# Patient Record
Sex: Female | Born: 1940 | Race: White | Hispanic: No | Marital: Married | Smoking: Never smoker
Health system: Southern US, Community
[De-identification: ages and names within clinical notes are randomized; demographics above are authoritative.]

## PROBLEM LIST (undated history)

## (undated) DIAGNOSIS — E119 Type 2 diabetes mellitus without complications: Secondary | ICD-10-CM

## (undated) DIAGNOSIS — H269 Unspecified cataract: Secondary | ICD-10-CM

## (undated) DIAGNOSIS — M419 Scoliosis, unspecified: Secondary | ICD-10-CM

## (undated) DIAGNOSIS — M810 Age-related osteoporosis without current pathological fracture: Secondary | ICD-10-CM

## (undated) DIAGNOSIS — I10 Essential (primary) hypertension: Secondary | ICD-10-CM

## (undated) HISTORY — PX: OTHER SURGICAL HISTORY: SHX169

## (undated) HISTORY — DX: Scoliosis, unspecified: M41.9

## (undated) HISTORY — DX: Unspecified cataract: H26.9

## (undated) HISTORY — PX: TUBAL LIGATION: SHX77

## (undated) HISTORY — PX: BACK SURGERY: SHX140

---

## 2014-02-16 DIAGNOSIS — IMO0001 Reserved for inherently not codable concepts without codable children: Secondary | ICD-10-CM | POA: Insufficient documentation

## 2014-05-04 DIAGNOSIS — M810 Age-related osteoporosis without current pathological fracture: Secondary | ICD-10-CM | POA: Insufficient documentation

## 2015-04-04 DIAGNOSIS — Z789 Other specified health status: Secondary | ICD-10-CM | POA: Diagnosis not present

## 2015-04-04 DIAGNOSIS — J320 Chronic maxillary sinusitis: Secondary | ICD-10-CM | POA: Diagnosis not present

## 2015-04-04 DIAGNOSIS — Z418 Encounter for other procedures for purposes other than remedying health state: Secondary | ICD-10-CM | POA: Diagnosis not present

## 2015-04-27 DIAGNOSIS — E559 Vitamin D deficiency, unspecified: Secondary | ICD-10-CM | POA: Diagnosis not present

## 2015-04-27 DIAGNOSIS — R29890 Loss of height: Secondary | ICD-10-CM | POA: Diagnosis not present

## 2015-04-27 DIAGNOSIS — M810 Age-related osteoporosis without current pathological fracture: Secondary | ICD-10-CM | POA: Diagnosis not present

## 2015-04-27 DIAGNOSIS — Z9181 History of falling: Secondary | ICD-10-CM | POA: Diagnosis not present

## 2015-05-08 DIAGNOSIS — E1165 Type 2 diabetes mellitus with hyperglycemia: Secondary | ICD-10-CM | POA: Diagnosis not present

## 2015-05-08 DIAGNOSIS — I10 Essential (primary) hypertension: Secondary | ICD-10-CM | POA: Diagnosis not present

## 2015-06-20 DIAGNOSIS — H66012 Acute suppurative otitis media with spontaneous rupture of ear drum, left ear: Secondary | ICD-10-CM | POA: Diagnosis not present

## 2015-06-20 DIAGNOSIS — H903 Sensorineural hearing loss, bilateral: Secondary | ICD-10-CM | POA: Diagnosis not present

## 2015-07-06 ENCOUNTER — Ambulatory Visit (INDEPENDENT_AMBULATORY_CARE_PROVIDER_SITE_OTHER): Payer: PPO | Admitting: Otolaryngology

## 2015-07-06 DIAGNOSIS — H66012 Acute suppurative otitis media with spontaneous rupture of ear drum, left ear: Secondary | ICD-10-CM | POA: Diagnosis not present

## 2015-08-14 DIAGNOSIS — I10 Essential (primary) hypertension: Secondary | ICD-10-CM | POA: Diagnosis not present

## 2015-08-14 DIAGNOSIS — Z713 Dietary counseling and surveillance: Secondary | ICD-10-CM | POA: Diagnosis not present

## 2015-08-14 DIAGNOSIS — Z6824 Body mass index (BMI) 24.0-24.9, adult: Secondary | ICD-10-CM | POA: Diagnosis not present

## 2015-08-14 DIAGNOSIS — E1165 Type 2 diabetes mellitus with hyperglycemia: Secondary | ICD-10-CM | POA: Diagnosis not present

## 2015-10-23 DIAGNOSIS — Z7189 Other specified counseling: Secondary | ICD-10-CM | POA: Diagnosis not present

## 2015-10-23 DIAGNOSIS — R5383 Other fatigue: Secondary | ICD-10-CM | POA: Diagnosis not present

## 2015-10-23 DIAGNOSIS — Z0000 Encounter for general adult medical examination without abnormal findings: Secondary | ICD-10-CM | POA: Diagnosis not present

## 2015-10-23 DIAGNOSIS — Z79899 Other long term (current) drug therapy: Secondary | ICD-10-CM | POA: Diagnosis not present

## 2015-10-23 DIAGNOSIS — Z1211 Encounter for screening for malignant neoplasm of colon: Secondary | ICD-10-CM | POA: Diagnosis not present

## 2015-10-23 DIAGNOSIS — Z299 Encounter for prophylactic measures, unspecified: Secondary | ICD-10-CM | POA: Diagnosis not present

## 2015-10-23 DIAGNOSIS — Z6824 Body mass index (BMI) 24.0-24.9, adult: Secondary | ICD-10-CM | POA: Diagnosis not present

## 2015-11-20 DIAGNOSIS — I10 Essential (primary) hypertension: Secondary | ICD-10-CM | POA: Diagnosis not present

## 2015-11-20 DIAGNOSIS — Z6824 Body mass index (BMI) 24.0-24.9, adult: Secondary | ICD-10-CM | POA: Diagnosis not present

## 2015-11-20 DIAGNOSIS — E1165 Type 2 diabetes mellitus with hyperglycemia: Secondary | ICD-10-CM | POA: Diagnosis not present

## 2015-11-20 DIAGNOSIS — Z713 Dietary counseling and surveillance: Secondary | ICD-10-CM | POA: Diagnosis not present

## 2016-02-28 DIAGNOSIS — Z299 Encounter for prophylactic measures, unspecified: Secondary | ICD-10-CM | POA: Diagnosis not present

## 2016-02-28 DIAGNOSIS — Z6824 Body mass index (BMI) 24.0-24.9, adult: Secondary | ICD-10-CM | POA: Diagnosis not present

## 2016-02-28 DIAGNOSIS — E1165 Type 2 diabetes mellitus with hyperglycemia: Secondary | ICD-10-CM | POA: Diagnosis not present

## 2016-02-28 DIAGNOSIS — J329 Chronic sinusitis, unspecified: Secondary | ICD-10-CM | POA: Diagnosis not present

## 2016-02-28 DIAGNOSIS — I10 Essential (primary) hypertension: Secondary | ICD-10-CM | POA: Diagnosis not present

## 2016-03-11 DIAGNOSIS — H35033 Hypertensive retinopathy, bilateral: Secondary | ICD-10-CM | POA: Diagnosis not present

## 2016-03-11 DIAGNOSIS — I10 Essential (primary) hypertension: Secondary | ICD-10-CM | POA: Diagnosis not present

## 2016-03-11 DIAGNOSIS — H43812 Vitreous degeneration, left eye: Secondary | ICD-10-CM | POA: Diagnosis not present

## 2016-03-11 DIAGNOSIS — H43813 Vitreous degeneration, bilateral: Secondary | ICD-10-CM | POA: Diagnosis not present

## 2016-03-11 DIAGNOSIS — H3589 Other specified retinal disorders: Secondary | ICD-10-CM | POA: Diagnosis not present

## 2016-03-11 DIAGNOSIS — H524 Presbyopia: Secondary | ICD-10-CM | POA: Diagnosis not present

## 2016-03-11 DIAGNOSIS — E119 Type 2 diabetes mellitus without complications: Secondary | ICD-10-CM | POA: Diagnosis not present

## 2016-03-11 DIAGNOSIS — H35023 Exudative retinopathy, bilateral: Secondary | ICD-10-CM | POA: Diagnosis not present

## 2016-05-31 DIAGNOSIS — J320 Chronic maxillary sinusitis: Secondary | ICD-10-CM | POA: Diagnosis not present

## 2016-05-31 DIAGNOSIS — Z713 Dietary counseling and surveillance: Secondary | ICD-10-CM | POA: Diagnosis not present

## 2016-05-31 DIAGNOSIS — Z789 Other specified health status: Secondary | ICD-10-CM | POA: Diagnosis not present

## 2016-05-31 DIAGNOSIS — Z299 Encounter for prophylactic measures, unspecified: Secondary | ICD-10-CM | POA: Diagnosis not present

## 2016-05-31 DIAGNOSIS — Z6824 Body mass index (BMI) 24.0-24.9, adult: Secondary | ICD-10-CM | POA: Diagnosis not present

## 2016-06-11 DIAGNOSIS — Z299 Encounter for prophylactic measures, unspecified: Secondary | ICD-10-CM | POA: Diagnosis not present

## 2016-06-11 DIAGNOSIS — I10 Essential (primary) hypertension: Secondary | ICD-10-CM | POA: Diagnosis not present

## 2016-06-11 DIAGNOSIS — M810 Age-related osteoporosis without current pathological fracture: Secondary | ICD-10-CM | POA: Diagnosis not present

## 2016-06-11 DIAGNOSIS — J329 Chronic sinusitis, unspecified: Secondary | ICD-10-CM | POA: Diagnosis not present

## 2016-06-11 DIAGNOSIS — F4320 Adjustment disorder, unspecified: Secondary | ICD-10-CM | POA: Diagnosis not present

## 2016-06-11 DIAGNOSIS — E1165 Type 2 diabetes mellitus with hyperglycemia: Secondary | ICD-10-CM | POA: Diagnosis not present

## 2016-06-11 DIAGNOSIS — Z713 Dietary counseling and surveillance: Secondary | ICD-10-CM | POA: Diagnosis not present

## 2016-07-02 DIAGNOSIS — M810 Age-related osteoporosis without current pathological fracture: Secondary | ICD-10-CM | POA: Diagnosis not present

## 2016-07-02 DIAGNOSIS — E1165 Type 2 diabetes mellitus with hyperglycemia: Secondary | ICD-10-CM | POA: Diagnosis not present

## 2016-07-02 DIAGNOSIS — Z6824 Body mass index (BMI) 24.0-24.9, adult: Secondary | ICD-10-CM | POA: Diagnosis not present

## 2016-07-02 DIAGNOSIS — I10 Essential (primary) hypertension: Secondary | ICD-10-CM | POA: Diagnosis not present

## 2016-07-02 DIAGNOSIS — R42 Dizziness and giddiness: Secondary | ICD-10-CM | POA: Diagnosis not present

## 2016-07-02 DIAGNOSIS — Z299 Encounter for prophylactic measures, unspecified: Secondary | ICD-10-CM | POA: Diagnosis not present

## 2016-07-02 DIAGNOSIS — F4320 Adjustment disorder, unspecified: Secondary | ICD-10-CM | POA: Diagnosis not present

## 2016-07-02 DIAGNOSIS — Z713 Dietary counseling and surveillance: Secondary | ICD-10-CM | POA: Diagnosis not present

## 2016-07-11 ENCOUNTER — Ambulatory Visit (INDEPENDENT_AMBULATORY_CARE_PROVIDER_SITE_OTHER): Payer: PPO | Admitting: Otolaryngology

## 2016-07-11 DIAGNOSIS — R42 Dizziness and giddiness: Secondary | ICD-10-CM

## 2016-07-11 DIAGNOSIS — H903 Sensorineural hearing loss, bilateral: Secondary | ICD-10-CM | POA: Diagnosis not present

## 2016-07-11 DIAGNOSIS — H8112 Benign paroxysmal vertigo, left ear: Secondary | ICD-10-CM

## 2016-08-22 ENCOUNTER — Ambulatory Visit (INDEPENDENT_AMBULATORY_CARE_PROVIDER_SITE_OTHER): Payer: PPO | Admitting: Otolaryngology

## 2016-08-22 DIAGNOSIS — R42 Dizziness and giddiness: Secondary | ICD-10-CM | POA: Diagnosis not present

## 2016-09-17 DIAGNOSIS — Z6824 Body mass index (BMI) 24.0-24.9, adult: Secondary | ICD-10-CM | POA: Diagnosis not present

## 2016-09-17 DIAGNOSIS — I10 Essential (primary) hypertension: Secondary | ICD-10-CM | POA: Diagnosis not present

## 2016-09-17 DIAGNOSIS — Z789 Other specified health status: Secondary | ICD-10-CM | POA: Diagnosis not present

## 2016-09-17 DIAGNOSIS — Z299 Encounter for prophylactic measures, unspecified: Secondary | ICD-10-CM | POA: Diagnosis not present

## 2016-09-17 DIAGNOSIS — E1165 Type 2 diabetes mellitus with hyperglycemia: Secondary | ICD-10-CM | POA: Diagnosis not present

## 2016-09-17 DIAGNOSIS — M810 Age-related osteoporosis without current pathological fracture: Secondary | ICD-10-CM | POA: Diagnosis not present

## 2016-10-29 DIAGNOSIS — Z0000 Encounter for general adult medical examination without abnormal findings: Secondary | ICD-10-CM | POA: Diagnosis not present

## 2016-10-29 DIAGNOSIS — R5383 Other fatigue: Secondary | ICD-10-CM | POA: Diagnosis not present

## 2016-10-29 DIAGNOSIS — Z1389 Encounter for screening for other disorder: Secondary | ICD-10-CM | POA: Diagnosis not present

## 2016-10-29 DIAGNOSIS — Z1231 Encounter for screening mammogram for malignant neoplasm of breast: Secondary | ICD-10-CM | POA: Diagnosis not present

## 2016-10-29 DIAGNOSIS — Z7189 Other specified counseling: Secondary | ICD-10-CM | POA: Diagnosis not present

## 2016-10-29 DIAGNOSIS — Z79899 Other long term (current) drug therapy: Secondary | ICD-10-CM | POA: Diagnosis not present

## 2016-10-29 DIAGNOSIS — I10 Essential (primary) hypertension: Secondary | ICD-10-CM | POA: Diagnosis not present

## 2016-10-29 DIAGNOSIS — M810 Age-related osteoporosis without current pathological fracture: Secondary | ICD-10-CM | POA: Diagnosis not present

## 2016-10-29 DIAGNOSIS — E1165 Type 2 diabetes mellitus with hyperglycemia: Secondary | ICD-10-CM | POA: Diagnosis not present

## 2016-10-29 DIAGNOSIS — Z1211 Encounter for screening for malignant neoplasm of colon: Secondary | ICD-10-CM | POA: Diagnosis not present

## 2016-10-29 DIAGNOSIS — Z299 Encounter for prophylactic measures, unspecified: Secondary | ICD-10-CM | POA: Diagnosis not present

## 2016-10-29 DIAGNOSIS — Z6824 Body mass index (BMI) 24.0-24.9, adult: Secondary | ICD-10-CM | POA: Diagnosis not present

## 2016-12-03 DIAGNOSIS — Z6824 Body mass index (BMI) 24.0-24.9, adult: Secondary | ICD-10-CM | POA: Diagnosis not present

## 2016-12-03 DIAGNOSIS — M810 Age-related osteoporosis without current pathological fracture: Secondary | ICD-10-CM | POA: Diagnosis not present

## 2016-12-03 DIAGNOSIS — E1165 Type 2 diabetes mellitus with hyperglycemia: Secondary | ICD-10-CM | POA: Diagnosis not present

## 2016-12-03 DIAGNOSIS — Z299 Encounter for prophylactic measures, unspecified: Secondary | ICD-10-CM | POA: Diagnosis not present

## 2016-12-03 DIAGNOSIS — J329 Chronic sinusitis, unspecified: Secondary | ICD-10-CM | POA: Diagnosis not present

## 2016-12-03 DIAGNOSIS — Z789 Other specified health status: Secondary | ICD-10-CM | POA: Diagnosis not present

## 2016-12-03 DIAGNOSIS — I10 Essential (primary) hypertension: Secondary | ICD-10-CM | POA: Diagnosis not present

## 2016-12-19 DIAGNOSIS — Z79899 Other long term (current) drug therapy: Secondary | ICD-10-CM | POA: Diagnosis not present

## 2016-12-19 DIAGNOSIS — Z780 Asymptomatic menopausal state: Secondary | ICD-10-CM | POA: Diagnosis not present

## 2016-12-19 DIAGNOSIS — Z5181 Encounter for therapeutic drug level monitoring: Secondary | ICD-10-CM | POA: Diagnosis not present

## 2016-12-19 DIAGNOSIS — M810 Age-related osteoporosis without current pathological fracture: Secondary | ICD-10-CM | POA: Diagnosis not present

## 2017-01-15 DIAGNOSIS — Z6825 Body mass index (BMI) 25.0-25.9, adult: Secondary | ICD-10-CM | POA: Diagnosis not present

## 2017-01-15 DIAGNOSIS — N390 Urinary tract infection, site not specified: Secondary | ICD-10-CM | POA: Diagnosis not present

## 2017-01-15 DIAGNOSIS — Z299 Encounter for prophylactic measures, unspecified: Secondary | ICD-10-CM | POA: Diagnosis not present

## 2017-01-15 DIAGNOSIS — Z713 Dietary counseling and surveillance: Secondary | ICD-10-CM | POA: Diagnosis not present

## 2017-01-15 DIAGNOSIS — R350 Frequency of micturition: Secondary | ICD-10-CM | POA: Diagnosis not present

## 2017-01-31 DIAGNOSIS — M1612 Unilateral primary osteoarthritis, left hip: Secondary | ICD-10-CM | POA: Diagnosis not present

## 2017-01-31 DIAGNOSIS — M5442 Lumbago with sciatica, left side: Secondary | ICD-10-CM | POA: Diagnosis not present

## 2017-01-31 DIAGNOSIS — I10 Essential (primary) hypertension: Secondary | ICD-10-CM | POA: Diagnosis not present

## 2017-01-31 DIAGNOSIS — M419 Scoliosis, unspecified: Secondary | ICD-10-CM | POA: Diagnosis not present

## 2017-01-31 DIAGNOSIS — M5440 Lumbago with sciatica, unspecified side: Secondary | ICD-10-CM | POA: Diagnosis not present

## 2017-01-31 DIAGNOSIS — M47816 Spondylosis without myelopathy or radiculopathy, lumbar region: Secondary | ICD-10-CM | POA: Diagnosis not present

## 2017-01-31 DIAGNOSIS — Z299 Encounter for prophylactic measures, unspecified: Secondary | ICD-10-CM | POA: Diagnosis not present

## 2017-01-31 DIAGNOSIS — M85852 Other specified disorders of bone density and structure, left thigh: Secondary | ICD-10-CM | POA: Diagnosis not present

## 2017-01-31 DIAGNOSIS — E1165 Type 2 diabetes mellitus with hyperglycemia: Secondary | ICD-10-CM | POA: Diagnosis not present

## 2017-01-31 DIAGNOSIS — Z6825 Body mass index (BMI) 25.0-25.9, adult: Secondary | ICD-10-CM | POA: Diagnosis not present

## 2017-01-31 DIAGNOSIS — M545 Low back pain: Secondary | ICD-10-CM | POA: Diagnosis not present

## 2017-01-31 DIAGNOSIS — M4854XA Collapsed vertebra, not elsewhere classified, thoracic region, initial encounter for fracture: Secondary | ICD-10-CM | POA: Diagnosis not present

## 2017-01-31 DIAGNOSIS — M9903 Segmental and somatic dysfunction of lumbar region: Secondary | ICD-10-CM | POA: Diagnosis not present

## 2017-02-01 ENCOUNTER — Emergency Department (HOSPITAL_COMMUNITY): Payer: PPO

## 2017-02-01 ENCOUNTER — Other Ambulatory Visit: Payer: Self-pay

## 2017-02-01 ENCOUNTER — Encounter (HOSPITAL_COMMUNITY): Payer: Self-pay | Admitting: *Deleted

## 2017-02-01 ENCOUNTER — Emergency Department (HOSPITAL_COMMUNITY)
Admission: EM | Admit: 2017-02-01 | Discharge: 2017-02-01 | Disposition: A | Discharge status: Home / Self Care | Payer: PPO | Attending: Emergency Medicine | Admitting: Emergency Medicine

## 2017-02-01 DIAGNOSIS — E119 Type 2 diabetes mellitus without complications: Secondary | ICD-10-CM | POA: Diagnosis not present

## 2017-02-01 DIAGNOSIS — M79605 Pain in left leg: Secondary | ICD-10-CM | POA: Diagnosis not present

## 2017-02-01 DIAGNOSIS — I10 Essential (primary) hypertension: Secondary | ICD-10-CM | POA: Diagnosis not present

## 2017-02-01 DIAGNOSIS — M5416 Radiculopathy, lumbar region: Secondary | ICD-10-CM

## 2017-02-01 DIAGNOSIS — M4726 Other spondylosis with radiculopathy, lumbar region: Secondary | ICD-10-CM

## 2017-02-01 DIAGNOSIS — M545 Low back pain: Secondary | ICD-10-CM | POA: Diagnosis not present

## 2017-02-01 DIAGNOSIS — R52 Pain, unspecified: Secondary | ICD-10-CM | POA: Diagnosis not present

## 2017-02-01 DIAGNOSIS — Z79899 Other long term (current) drug therapy: Secondary | ICD-10-CM | POA: Insufficient documentation

## 2017-02-01 DIAGNOSIS — M79662 Pain in left lower leg: Secondary | ICD-10-CM | POA: Diagnosis not present

## 2017-02-01 HISTORY — DX: Essential (primary) hypertension: I10

## 2017-02-01 HISTORY — DX: Age-related osteoporosis without current pathological fracture: M81.0

## 2017-02-01 HISTORY — DX: Type 2 diabetes mellitus without complications: E11.9

## 2017-02-01 LAB — BASIC METABOLIC PANEL
ANION GAP: 8 (ref 5–15)
BUN: 18 mg/dL (ref 6–20)
CALCIUM: 9.3 mg/dL (ref 8.9–10.3)
CO2: 25 mmol/L (ref 22–32)
CREATININE: 0.66 mg/dL (ref 0.44–1.00)
Chloride: 107 mmol/L (ref 101–111)
Glucose, Bld: 180 mg/dL — ABNORMAL HIGH (ref 65–99)
Potassium: 4.1 mmol/L (ref 3.5–5.1)
SODIUM: 140 mmol/L (ref 135–145)

## 2017-02-01 LAB — CBC WITH DIFFERENTIAL/PLATELET
BASOS ABS: 0 10*3/uL (ref 0.0–0.1)
BASOS PCT: 0 %
EOS ABS: 0 10*3/uL (ref 0.0–0.7)
Eosinophils Relative: 0 %
HEMATOCRIT: 44.5 % (ref 36.0–46.0)
HEMOGLOBIN: 14.8 g/dL (ref 12.0–15.0)
Lymphocytes Relative: 12 %
Lymphs Abs: 0.8 10*3/uL (ref 0.7–4.0)
MCH: 30.7 pg (ref 26.0–34.0)
MCHC: 33.3 g/dL (ref 30.0–36.0)
MCV: 92.3 fL (ref 78.0–100.0)
MONOS PCT: 5 %
Monocytes Absolute: 0.3 10*3/uL (ref 0.1–1.0)
NEUTROS ABS: 5.6 10*3/uL (ref 1.7–7.7)
NEUTROS PCT: 83 %
Platelets: 295 10*3/uL (ref 150–400)
RBC: 4.82 MIL/uL (ref 3.87–5.11)
RDW: 14.1 % (ref 11.5–15.5)
WBC: 6.8 10*3/uL (ref 4.0–10.5)

## 2017-02-01 MED ORDER — SODIUM CHLORIDE 0.9 % IV SOLN
INTRAVENOUS | Status: DC
  Administered 2017-02-01: 19:00:00 via INTRAVENOUS

## 2017-02-01 MED ORDER — FENTANYL CITRATE (PF) 100 MCG/2ML IJ SOLN
25.0000 ug | Freq: Once | INTRAMUSCULAR | Status: AC
  Administered 2017-02-01: 25 ug via INTRAVENOUS
  Filled 2017-02-01: qty 2

## 2017-02-01 MED ORDER — HYDROCODONE-ACETAMINOPHEN 5-325 MG PO TABS
1.0000 | ORAL_TABLET | Freq: Once | ORAL | Status: DC
  Filled 2017-02-01: qty 1

## 2017-02-01 MED ORDER — ONDANSETRON HCL 4 MG/2ML IJ SOLN
4.0000 mg | Freq: Once | INTRAMUSCULAR | Status: AC
  Administered 2017-02-01: 4 mg via INTRAVENOUS
  Filled 2017-02-01: qty 2

## 2017-02-01 MED ORDER — HYDROCODONE-ACETAMINOPHEN 5-325 MG PO TABS: 1.0000 | ORAL_TABLET | ORAL | 0 refills | Status: DC | PRN

## 2017-02-01 MED ORDER — ACETAMINOPHEN 325 MG PO TABS: 650.0000 mg | ORAL_TABLET | Freq: Once | ORAL | Status: DC

## 2017-02-01 MED ORDER — HYDROCODONE-ACETAMINOPHEN 5-325 MG PO TABS: 1.0000 | ORAL_TABLET | ORAL | 0 refills | Status: AC | PRN

## 2017-02-01 MED ORDER — FENTANYL CITRATE (PF) 100 MCG/2ML IJ SOLN: 50.0000 ug | Freq: Once | INTRAMUSCULAR | Status: DC

## 2017-02-01 NOTE — ED Triage Notes (Signed)
Pt brought in by [facility] with c/o lower leg pain that started a few days ago. Denies injury. Pt also c/o nausea that started today. Denies vomiting. BP 197/71 and CBG 273 for EMS. Pt was given Prednisone by PCP for the pain, no relief.

## 2017-02-01 NOTE — ED Provider Notes (Signed)
[facility] EMERGENCY DEPARTMENT Provider Note   CSN: [id number] Arrival date & time: 02/01/17  1711     History   Chief Complaint Chief Complaint  Patient presents with  . Leg Pain    HPI Lacey Snyder is a 76 y.o. female.  She presents for evaluation of the left leg pain, which is worse with walking for several days.  Her PCP, and sent for outpatient x-rays of the lower back and left hip.  She was told that these showed arthritis in her lower back but no fractures.  She was started on anti-inflammatory with an injection of steroid and a prescription of prednisone which she started today.  She is not taking anything for pain.  She denies fever, chills, nausea, vomiting, weakness, dizziness, bowel or bladder dysfunction.  She has chronic lower back pain which is unchanged.  There is been no trauma to the back or left leg.  There are no other known modifying factors.  HPI  Past Medical History:  Diagnosis Date  . Diabetes mellitus without complication ([place])   . Hypertension   . Osteoporosis     There are no active problems to display for this patient.   Past Surgical History:  Procedure Laterality Date  . Arm Surgery Left    fracture  . TUBAL LIGATION      OB History    No data available       Home Medications    Prior to Admission medications   Medication Sig Start Date End Date Taking? Authorizing Provider  baclofen (LIORESAL) 20 MG tablet Take 20 mg by mouth 2 (two) times daily as needed. 01/31/17  Yes [provider]  Cholecalciferol (VITAMIN D-1000 MAX ST) 1000 units tablet Take 1 tablet by mouth daily.   Yes [provider]  lisinopril-hydrochlorothiazide (PRINZIDE,ZESTORETIC) 20-12.5 MG tablet Take 1 tablet by mouth daily. 11/04/16  Yes [provider]  Omega-3 Fatty Acids (FISH OIL) 1000 MG CAPS Take 1 capsule by mouth daily.   Yes [provider]  predniSONE (STERAPRED UNI-PAK 21 TAB) 5 MG (21) TBPK tablet Take 5-30 mg  by mouth See admin instructions. Take as directed starting on 01/31/2017 (6,5,4,3,2,1) 01/31/17  Yes [provider]  HYDROcodone-acetaminophen (NORCO/VICODIN) 5-325 MG tablet Take 1 tablet by mouth every 4 (four) hours as needed. 02/01/17   Daleen Bo, MD  HYDROcodone-acetaminophen (NORCO/VICODIN) 5-325 MG tablet Take 1 tablet by mouth every 4 (four) hours as needed. 02/01/17   Daleen Bo, MD    Family History No family history on file.  Social History Social History   Tobacco Use  . Smoking status: Never Smoker  . Smokeless tobacco: Never Used  Substance Use Topics  . Alcohol use: No    Frequency: Never  . Drug use: No     Allergies   Patient has no known allergies.   Review of Systems Review of Systems  All other systems reviewed and are negative.    Physical Exam Updated Vital Signs BP (!) 174/76   Pulse 64   Temp 98.4 F (36.9 C) (Oral)   Resp 14   Ht 5\' 2"  (1.575 m)   Wt 59 kg (130 lb)   SpO2 97%   BMI 23.78 kg/m   Physical Exam  Constitutional: She is oriented to person, place, and time. She appears well-developed.  Elderly, frail  HENT:  Head: Normocephalic and atraumatic.  Eyes: Conjunctivae and EOM are normal. Pupils are equal, round, and reactive to light.  Neck:  Normal range of motion and phonation normal. Neck supple.  Cardiovascular: Normal rate.  Pulmonary/Chest: Effort normal.  Musculoskeletal:  She is able to sit up without significant lumbar pain.  There is evident thoraco-lumbar scoliosis.  The back is nontender to palpation.  Normal range of motion of arms and legs bilaterally.  No focal tenderness of the left lower leg.  There is no swelling of the left lower leg, foot or popliteal regions.  Neurological: She is alert and oriented to person, place, and time. She exhibits normal muscle tone.  Skin: Skin is warm and dry.  Psychiatric: She has a normal mood and affect. Her behavior is normal. Judgment and thought content normal.    Nursing note and vitals reviewed.    ED Treatments / Results  Labs (all labs ordered are listed, but only abnormal results are displayed) Labs Reviewed  BASIC METABOLIC PANEL - Abnormal; Notable for the following components:      Result Value   Glucose, Bld 180 (*)    All other components within normal limits  CBC WITH DIFFERENTIAL/PLATELET    EKG  EKG Interpretation None       Radiology Dg Tibia/fibula Left  Result Date: 02/01/2017 CLINICAL DATA:  Left lower leg pain for several days EXAM: LEFT TIBIA AND FIBULA - 2 VIEW COMPARISON:  None. FINDINGS: There is no evidence of fracture or other focal bone lesions. Soft tissues are unremarkable. IMPRESSION: No acute abnormality noted. Electronically Signed   By: Inez Catalina M.D.   On: 02/01/2017 18:25   Ct Lumbar Spine Wo Contrast  Result Date: 02/01/2017 CLINICAL DATA:  LEFT leg pain and weakness for several days. History of osteoporosis. EXAM: CT LUMBAR SPINE WITHOUT CONTRAST TECHNIQUE: Multidetector CT imaging of the lumbar spine was performed without intravenous contrast administration. Multiplanar CT image reconstructions were also generated. COMPARISON:  None. FINDINGS: SEGMENTATION: For the purposes of this report the last well-formed intervertebral disc space is reported as L5-S1. ALIGNMENT: Maintained lumbar lordosis. Grade 1 L2-3 retrolisthesis, minimal grade 1 L3-4 retrolisthesis. No spondylolysis. VERTEBRAE: Vertebral bodies and posterior elements are intact. Old T12 moderate compression fracture superior endplate Schmorl's node. Severe L2-3 disc height loss with vacuum disc and bulky ventral endplate spurring, consistent with degenerative disc. Moderate L3-4, mild T12-L1 and L1-2 degenerative discs with vacuum phenomena. Osteopenia without destructive bony lesions. PARASPINAL AND OTHER SOFT TISSUES: Small LEFT pleural effusion. Mild calcific atherosclerosis aortoiliac vessels. 11 mm LEFT interpolar cyst. DISC LEVELS: T12-L1:  Small broad-based disc osteophyte complex without canal stenosis or neural foraminal narrowing. L1-2: Annular bulging, no canal stenosis or neural foraminal narrowing. L2-3: Retrolisthesis. Small broad-based disc osteophyte complex, mild facet arthropathy and ligamentum flavum redundancy. Minimal canal stenosis. No neural foraminal narrowing. L3-4: Retrolisthesis. Moderate broad-based disc bulge, superimposed suspected isodense 10 x 8 mm contiguous disc extrusion. Moderate facet arthropathy and ligamentum flavum redundancy. Moderate canal stenosis with narrowed LEFT lateral recess which could affect the traversing LEFT L4 nerve. Mild bilateral neural foraminal narrowing. L4-5: Small broad-based disc bulge, moderate facet arthropathy and ligamentum flavum redundancy without canal stenosis or neural foraminal narrowing. L5-S1: Small broad-based disc bulge, mild to moderate facet arthropathy and ligamentum flavum redundancy without canal stenosis or neural foraminal narrowing. IMPRESSION: 1. 10 x 8 mm suspected L3-4 disc extrusion resulting in traversing LEFT L4 nerve impingement which could be confirmed with MRI. 2. Grade 1 L2-3 retrolisthesis and grade 1 L3-4 retrolisthesis on degenerative basis. 3. Degenerative change of the lumbar spine. Moderate canal stenosis L3-4. 4. Mild  L3-4 neural foraminal narrowing. Aortic Atherosclerosis (ICD10-I70.0). Electronically Signed   By: Elon Alas M.D.   On: 02/01/2017 20:05    Procedures Procedures (including critical care time)  Medications Ordered in ED Medications  0.9 %  sodium chloride infusion ( Intravenous Stopped 02/01/17 2037)  fentaNYL (SUBLIMAZE) injection 25 mcg (25 mcg Intravenous Given 02/01/17 1908)  ondansetron (ZOFRAN) injection 4 mg (4 mg Intravenous Given 02/01/17 1908)  fentaNYL (SUBLIMAZE) injection 25 mcg (25 mcg Intravenous Given 02/01/17 2032)     Initial Impression / Assessment and Plan / ED Course  I have reviewed the triage vital  signs and the nursing notes.  Pertinent labs & imaging results that were available during my care of the patient were reviewed by me and considered in my medical decision making (see chart for details).  Clinical Course as of Feb 02 2156  Sat Feb 01, 2017  1856 The patient is now vomiting and has not yet received her medications.  Will hold on oral treatment. Additional treatment ordered IV fluids, IV Zofran and fentanyl.  [EW]  1916 There are no additional family members at the patient's bedside, the patient has received her medication and is more comfortable in appearance, and the family members are grateful that she has been medicated.  [EW]    Clinical Course User Index [EW] Daleen Bo, MD     Patient Vitals for the past 24 hrs:  BP Temp Temp src Pulse Resp SpO2 Height Weight  02/01/17 2130 (!) 174/76 - - 64 - 97 % - -  02/01/17 2100 (!) 168/57 - - (!) 54 - 95 % - -  02/01/17 2036 (!) 166/64 - - 65 14 100 % - -  02/01/17 2030 (!) 166/64 - - 61 - 98 % - -  02/01/17 2000 (!) 172/66 - - (!) 54 - 91 % - -  02/01/17 1900 (!) 187/80 - - 64 - 98 % - -  02/01/17 1830 (!) 168/97 - - 60 - 92 % - -  02/01/17 1800 (!) 174/74 - - 65 - 96 % - -  02/01/17 1730 (!) 178/83 - - 71 - 95 % - -  02/01/17 1718 (!) 161/92 98.4 F (36.9 C) Oral 66 16 97 % - -  02/01/17 1717 - - - - - - 5\' 2"  (1.575 m) 59 kg (130 lb)    At discharge-patient states her pain is controlled and she is ready to go home.  Findings discussed with family members and patient, all questions answered    Final Clinical Impressions(s) / ED Diagnoses   Final diagnoses:  Lumbar radiculopathy  Osteoarthritis of spine with radiculopathy, lumbar region    Radicular lumbar pain without spinal myelopathy.  Doubt fracture, DVT, soft tissue infection.  Nursing Notes Reviewed/ Care Coordinated Applicable Imaging Reviewed Interpretation of Laboratory Data incorporated into ED treatment  The patient appears reasonably screened  and/or stabilized for discharge and I doubt any other medical condition or other [facility] requiring further screening, evaluation, or treatment in the ED at this time prior to discharge.  Plan: Home Medications-continue current medications; Home Treatments-rest, heat applications; return here if the recommended treatment, does not improve the symptoms; Recommended follow up-PCP, orthopedist or neurosurgery as needed.   ED Discharge Orders        Ordered    HYDROcodone-acetaminophen (NORCO/VICODIN) 5-325 MG tablet  Every 4 hours PRN     02/01/17 2118    HYDROcodone-acetaminophen (NORCO/VICODIN) 5-325 MG tablet  Every 4 hours PRN  02/01/17 2118       Daleen Bo, MD 02/01/17 2159

## 2017-02-01 NOTE — ED Notes (Signed)
Pt taken to xray 

## 2017-02-01 NOTE — Discharge Instructions (Signed)
Use heat on the lower back 3 or 4 times a day, to help your discomfort.  Continue taking the prednisone, and baclofen which were previously prescribed.  Follow-up with your primary care doctor, and orthopedic doctor, or a neurosurgeon for ongoing management of your lumbar radiculopathy.  You have moderate degenerative joint disease of the lower back which is the basic cause for your discomfort.  Return here, if needed, for problems.

## 2017-02-04 DIAGNOSIS — M47816 Spondylosis without myelopathy or radiculopathy, lumbar region: Secondary | ICD-10-CM | POA: Diagnosis not present

## 2017-02-04 DIAGNOSIS — M9903 Segmental and somatic dysfunction of lumbar region: Secondary | ICD-10-CM | POA: Diagnosis not present

## 2017-02-04 DIAGNOSIS — M5442 Lumbago with sciatica, left side: Secondary | ICD-10-CM | POA: Diagnosis not present

## 2017-02-05 DIAGNOSIS — M47816 Spondylosis without myelopathy or radiculopathy, lumbar region: Secondary | ICD-10-CM | POA: Diagnosis not present

## 2017-02-05 DIAGNOSIS — M5442 Lumbago with sciatica, left side: Secondary | ICD-10-CM | POA: Diagnosis not present

## 2017-02-05 DIAGNOSIS — M9903 Segmental and somatic dysfunction of lumbar region: Secondary | ICD-10-CM | POA: Diagnosis not present

## 2017-02-06 MED FILL — Oxycodone w/ Acetaminophen Tab 5-325 MG: ORAL | Qty: 6 | Status: CN

## 2017-02-06 MED FILL — Hydrocodone-Acetaminophen Tab 5-325 MG: ORAL | Qty: 6 | Status: AC

## 2017-02-07 DIAGNOSIS — M47816 Spondylosis without myelopathy or radiculopathy, lumbar region: Secondary | ICD-10-CM | POA: Diagnosis not present

## 2017-02-07 DIAGNOSIS — M5442 Lumbago with sciatica, left side: Secondary | ICD-10-CM | POA: Diagnosis not present

## 2017-02-07 DIAGNOSIS — M9903 Segmental and somatic dysfunction of lumbar region: Secondary | ICD-10-CM | POA: Diagnosis not present

## 2017-02-10 DIAGNOSIS — M47816 Spondylosis without myelopathy or radiculopathy, lumbar region: Secondary | ICD-10-CM | POA: Diagnosis not present

## 2017-02-10 DIAGNOSIS — M9903 Segmental and somatic dysfunction of lumbar region: Secondary | ICD-10-CM | POA: Diagnosis not present

## 2017-02-10 DIAGNOSIS — M5442 Lumbago with sciatica, left side: Secondary | ICD-10-CM | POA: Diagnosis not present

## 2017-02-13 ENCOUNTER — Encounter (HOSPITAL_COMMUNITY): Payer: Self-pay | Admitting: *Deleted

## 2017-02-13 ENCOUNTER — Other Ambulatory Visit: Payer: Self-pay

## 2017-02-13 ENCOUNTER — Emergency Department (HOSPITAL_COMMUNITY)
Admission: EM | Admit: 2017-02-13 | Discharge: 2017-02-13 | Disposition: A | Discharge status: Home / Self Care | Payer: PPO | Attending: Emergency Medicine | Admitting: Emergency Medicine

## 2017-02-13 ENCOUNTER — Emergency Department (HOSPITAL_COMMUNITY): Payer: PPO

## 2017-02-13 DIAGNOSIS — M5126 Other intervertebral disc displacement, lumbar region: Secondary | ICD-10-CM | POA: Insufficient documentation

## 2017-02-13 DIAGNOSIS — I10 Essential (primary) hypertension: Secondary | ICD-10-CM | POA: Insufficient documentation

## 2017-02-13 DIAGNOSIS — E119 Type 2 diabetes mellitus without complications: Secondary | ICD-10-CM | POA: Insufficient documentation

## 2017-02-13 DIAGNOSIS — M79605 Pain in left leg: Secondary | ICD-10-CM | POA: Diagnosis present

## 2017-02-13 DIAGNOSIS — M5127 Other intervertebral disc displacement, lumbosacral region: Secondary | ICD-10-CM | POA: Diagnosis not present

## 2017-02-13 DIAGNOSIS — M545 Low back pain: Secondary | ICD-10-CM | POA: Diagnosis not present

## 2017-02-13 MED ORDER — ONDANSETRON 4 MG PO TBDP
4.0000 mg | ORAL_TABLET | Freq: Once | ORAL | Status: AC
  Administered 2017-02-13: 4 mg via ORAL
  Filled 2017-02-13: qty 1

## 2017-02-13 MED ORDER — ONDANSETRON HCL 4 MG PO TABS: 4.0000 mg | ORAL_TABLET | Freq: Four times a day (QID) | ORAL | 0 refills | Status: AC

## 2017-02-13 MED ORDER — ONDANSETRON 4 MG PO TBDP
4.0000 mg | ORAL_TABLET | Freq: Once | ORAL | Status: AC | PRN
  Administered 2017-02-13: 4 mg via ORAL
  Filled 2017-02-13: qty 1

## 2017-02-13 MED ORDER — PREDNISONE 10 MG PO TABS: 10.0000 mg | ORAL_TABLET | Freq: Every day | ORAL | 0 refills | Status: DC

## 2017-02-13 NOTE — Discharge Instructions (Signed)
You have a herniated disc between lumbar 3 and lumbar 4 which is putting pressure on the nerve root of lumbar 4.  Prescription for prednisone and nausea medication.  Follow-up with neurosurgeon.  Phone number given.

## 2017-02-13 NOTE — ED Provider Notes (Signed)
[facility] EMERGENCY DEPARTMENT Provider Note   CSN: [id number] Arrival date & time: 02/13/17  1333     History   Chief Complaint Chief Complaint  Patient presents with  . Leg Pain  . Back Pain    HPI Lacey Snyder is a 76 y.o. female.  Persistent pain in the left lateral lower leg for 2-3 weeks.  CT of lumbar spine on 02/01/17 revealed a L3-L4 disc protrusion.  No bowel or bladder incontinence.  Patient has tried baclofen and prednisone with minimal relief.  She is able to ambulate with pain.  Severity of pain is moderate.  Nothing makes symptoms better or worse.      Past Medical History:  Diagnosis Date  . Diabetes mellitus without complication ([place])   . Hypertension   . Osteoporosis     There are no active problems to display for this patient.   Past Surgical History:  Procedure Laterality Date  . Arm Surgery Left    fracture  . TUBAL LIGATION      OB History    No data available       Home Medications    Prior to Admission medications   Medication Sig Start Date End Date Taking? Authorizing Provider  Cholecalciferol (VITAMIN D-1000 MAX ST) 1000 units tablet Take 1 tablet by mouth daily.   Yes [provider]  HYDROcodone-acetaminophen (NORCO/VICODIN) 5-325 MG tablet Take 1 tablet by mouth every 4 (four) hours as needed. 02/01/17  Yes Daleen Bo, MD  lisinopril-hydrochlorothiazide (PRINZIDE,ZESTORETIC) 20-12.5 MG tablet Take 1 tablet by mouth daily. 11/04/16  Yes [provider]  Omega-3 Fatty Acids (FISH OIL) 1000 MG CAPS Take 1 capsule by mouth daily.   Yes [provider]  HYDROcodone-acetaminophen (NORCO/VICODIN) 5-325 MG tablet Take 1 tablet by mouth every 4 (four) hours as needed. Patient not taking: Reported on 02/13/2017 02/01/17   Daleen Bo, MD  ondansetron (ZOFRAN) 4 MG tablet Take 1 tablet (4 mg total) by mouth every 6 (six) hours. 02/13/17   Nat Christen, MD  predniSONE (DELTASONE) 10 MG  tablet Take 1 tablet (10 mg total) by mouth daily with breakfast. 2 tablets daily for 5 days; 1 tablet daily for 5 days 02/13/17   Nat Christen, MD    Family History History reviewed. No pertinent family history.  Social History Social History   Tobacco Use  . Smoking status: Never Smoker  . Smokeless tobacco: Never Used  Substance Use Topics  . Alcohol use: No    Frequency: Never  . Drug use: No     Allergies   Patient has no known allergies.   Review of Systems Review of Systems  All other systems reviewed and are negative.    Physical Exam Updated Vital Signs BP (!) 123/54   Pulse (!) 56   Temp 97.6 F (36.4 C) (Oral)   Resp 14   SpO2 92%   Physical Exam  Constitutional: She is oriented to person, place, and time. She appears well-developed and well-nourished.  HENT:  Head: Normocephalic and atraumatic.  Eyes: Conjunctivae are normal.  Neck: Neck supple.  Cardiovascular: Normal rate and regular rhythm.  Pulmonary/Chest: Effort normal and breath sounds normal.  Abdominal: Soft. Bowel sounds are normal.  Musculoskeletal: Normal range of motion.  Neurological: She is alert and oriented to person, place, and time.  Patient points to lateral aspect of left fibula as area of pain and numbness  Skin: Skin is warm and dry.  Psychiatric: She has  a normal mood and affect. Her behavior is normal.  Nursing note and vitals reviewed.    ED Treatments / Results  Labs (all labs ordered are listed, but only abnormal results are displayed) Labs Reviewed - No data to display  EKG  EKG Interpretation None       Radiology Mr Lumbar Spine Wo Contrast  Result Date: 02/13/2017 CLINICAL DATA:  Initial evaluation for left lower extremity radicular pain. EXAM: MRI LUMBAR SPINE WITHOUT CONTRAST TECHNIQUE: Multiplanar, multisequence MR imaging of the lumbar spine was performed. No intravenous contrast was administered. COMPARISON:  Prior CT from 02/01/2017. FINDINGS:  Segmentation: Normal segmentation. Lowest well-formed disc labeled L5-S1. Alignment: 5 mm retrolisthesis of L3 on L4. Trace retrolisthesis of L2 on L3. Mild reversal of the normal upper lumbar lordosis. Vertebrae: T12 compression fracture is chronic in nature without associated edema. Superimposed endplate Schmorl's node. Vertebral body heights otherwise maintained. No acute fracture. Chronic reactive endplate changes about the L2-3 and L3-4 interspaces. Bone marrow signal intensity within normal limits. No discrete or worrisome osseous lesions. Conus medullaris and cauda equina: Conus extends to the L1 level. Conus and cauda equina appear normal. Paraspinal and other soft tissues: Paraspinous soft tissues demonstrate no acute abnormality. T2 hyperintense cyst noted within the left kidney. Visualized visceral structures otherwise unremarkable. Disc levels: T12-L1:  Disc bulge with disc desiccation.  No stenosis. L1-2: Disc desiccation without significant disc bulge. No stenosis. L2-3: Trace retrolisthesis. Diffuse disc bulge with disc desiccation and intervertebral disc space narrowing. Chronic reactive endplate changes. Superimposed shallow left foraminal disc protrusion without impingement. No canal or foraminal stenosis. L3-4: 5 mm retrolisthesis. Diffuse disc bulge with disc desiccation and intervertebral disc space narrowing. Superimposed left subarticular disc extrusion with inferior migration, extending posteriorly to the L4 vertebral body. Inferior migration extends 2.2 cm inferior to the parent L3-4 disc space. Extruded disc fragment measures 9 x 13 x 23 mm (AP by transverse by craniocaudad). Probable impingement of the descending left L4 nerve root. Mild facet ligamentum flavum hypertrophy. Moderate spinal stenosis. Moderate right with mild left L3 foraminal narrowing. L4-5: Disc desiccation with mild annular disc bulge. Mild to moderate facet and ligamentum flavum hypertrophy. No significant canal  stenosis. Mild bilateral L4 foraminal narrowing. L5-S1: No significant disc bulge. Mild facet hypertrophy. No stenosis. IMPRESSION: 1. Moderate size left subarticular disc extrusion with inferior migration at L3-4 with probable L4 nerve root impingement. 2. Additional mild multilevel degenerative spondylolysis as above without significant stenosis or impingement. Electronically Signed   By: Jeannine Boga M.D.   On: 02/13/2017 20:44    Procedures Procedures (including critical care time)  Medications Ordered in ED Medications  ondansetron (ZOFRAN-ODT) disintegrating tablet 4 mg (4 mg Oral Given 02/13/17 1420)  ondansetron (ZOFRAN-ODT) disintegrating tablet 4 mg (4 mg Oral Given 02/13/17 1836)     Initial Impression / Assessment and Plan / ED Course  I have reviewed the triage vital signs and the nursing notes.  Pertinent labs & imaging results that were available during my care of the patient were reviewed by me and considered in my medical decision making (see chart for details).    MRI of lumbar spine reveals an L3-4 disc protrusion.  This most likely explains her symptoms.  Will Rx prednisone and Zofran 4 mg.  Referral to neurosurgery.  Discussed with patient and her daughter.   Final Clinical Impressions(s) / ED Diagnoses   Final diagnoses:  HNP (herniated nucleus pulposus), lumbar    ED Discharge Orders  Ordered    ondansetron (ZOFRAN) 4 MG tablet  Every 6 hours     02/13/17 2146    predniSONE (DELTASONE) 10 MG tablet  Daily with breakfast     02/13/17 2146       Nat Christen, MD 02/13/17 2201

## 2017-02-13 NOTE — ED Triage Notes (Signed)
Pt reports ongoing left leg pain, more severe in left lower leg. Difficulty ambulating. Has been seen for same in past and had ct scans. Was told she has compression fractures with pinched nerve. No relief with baclofen at home.

## 2017-02-13 NOTE — ED Notes (Signed)
EDP made aware that patient is still feeling nauseated.

## 2017-02-28 DIAGNOSIS — I10 Essential (primary) hypertension: Secondary | ICD-10-CM | POA: Diagnosis not present

## 2017-02-28 DIAGNOSIS — N390 Urinary tract infection, site not specified: Secondary | ICD-10-CM | POA: Diagnosis not present

## 2017-02-28 DIAGNOSIS — M5127 Other intervertebral disc displacement, lumbosacral region: Secondary | ICD-10-CM | POA: Diagnosis not present

## 2017-02-28 DIAGNOSIS — E1165 Type 2 diabetes mellitus with hyperglycemia: Secondary | ICD-10-CM | POA: Diagnosis not present

## 2017-02-28 DIAGNOSIS — Z6824 Body mass index (BMI) 24.0-24.9, adult: Secondary | ICD-10-CM | POA: Diagnosis not present

## 2017-02-28 DIAGNOSIS — Z789 Other specified health status: Secondary | ICD-10-CM | POA: Diagnosis not present

## 2017-02-28 DIAGNOSIS — M5416 Radiculopathy, lumbar region: Secondary | ICD-10-CM | POA: Diagnosis not present

## 2017-02-28 DIAGNOSIS — R300 Dysuria: Secondary | ICD-10-CM | POA: Diagnosis not present

## 2017-02-28 DIAGNOSIS — Z299 Encounter for prophylactic measures, unspecified: Secondary | ICD-10-CM | POA: Diagnosis not present

## 2017-03-12 DIAGNOSIS — M810 Age-related osteoporosis without current pathological fracture: Secondary | ICD-10-CM | POA: Diagnosis not present

## 2017-03-12 DIAGNOSIS — I10 Essential (primary) hypertension: Secondary | ICD-10-CM | POA: Diagnosis not present

## 2017-03-12 DIAGNOSIS — Z789 Other specified health status: Secondary | ICD-10-CM | POA: Diagnosis not present

## 2017-03-12 DIAGNOSIS — E1165 Type 2 diabetes mellitus with hyperglycemia: Secondary | ICD-10-CM | POA: Diagnosis not present

## 2017-03-12 DIAGNOSIS — Z299 Encounter for prophylactic measures, unspecified: Secondary | ICD-10-CM | POA: Diagnosis not present

## 2017-03-12 DIAGNOSIS — Z6825 Body mass index (BMI) 25.0-25.9, adult: Secondary | ICD-10-CM | POA: Diagnosis not present

## 2017-03-13 DIAGNOSIS — M47816 Spondylosis without myelopathy or radiculopathy, lumbar region: Secondary | ICD-10-CM | POA: Diagnosis not present

## 2017-03-13 DIAGNOSIS — M9903 Segmental and somatic dysfunction of lumbar region: Secondary | ICD-10-CM | POA: Diagnosis not present

## 2017-03-13 DIAGNOSIS — M5442 Lumbago with sciatica, left side: Secondary | ICD-10-CM | POA: Diagnosis not present

## 2017-03-24 DIAGNOSIS — M5126 Other intervertebral disc displacement, lumbar region: Secondary | ICD-10-CM | POA: Diagnosis not present

## 2017-03-24 DIAGNOSIS — M5116 Intervertebral disc disorders with radiculopathy, lumbar region: Secondary | ICD-10-CM | POA: Diagnosis not present

## 2017-04-30 DIAGNOSIS — Z4789 Encounter for other orthopedic aftercare: Secondary | ICD-10-CM | POA: Diagnosis not present

## 2017-04-30 DIAGNOSIS — M5127 Other intervertebral disc displacement, lumbosacral region: Secondary | ICD-10-CM | POA: Diagnosis not present

## 2017-06-17 DIAGNOSIS — Z6825 Body mass index (BMI) 25.0-25.9, adult: Secondary | ICD-10-CM | POA: Diagnosis not present

## 2017-06-17 DIAGNOSIS — I10 Essential (primary) hypertension: Secondary | ICD-10-CM | POA: Diagnosis not present

## 2017-06-17 DIAGNOSIS — Z299 Encounter for prophylactic measures, unspecified: Secondary | ICD-10-CM | POA: Diagnosis not present

## 2017-06-17 DIAGNOSIS — Z713 Dietary counseling and surveillance: Secondary | ICD-10-CM | POA: Diagnosis not present

## 2017-06-17 DIAGNOSIS — E1165 Type 2 diabetes mellitus with hyperglycemia: Secondary | ICD-10-CM | POA: Diagnosis not present

## 2017-07-09 DIAGNOSIS — S81851A Open bite, right lower leg, initial encounter: Secondary | ICD-10-CM | POA: Diagnosis not present

## 2017-07-09 DIAGNOSIS — S91351A Open bite, right foot, initial encounter: Secondary | ICD-10-CM | POA: Diagnosis not present

## 2017-07-09 DIAGNOSIS — E119 Type 2 diabetes mellitus without complications: Secondary | ICD-10-CM | POA: Diagnosis not present

## 2017-07-09 DIAGNOSIS — Z79899 Other long term (current) drug therapy: Secondary | ICD-10-CM | POA: Diagnosis not present

## 2017-07-09 DIAGNOSIS — W540XXA Bitten by dog, initial encounter: Secondary | ICD-10-CM | POA: Diagnosis not present

## 2017-07-09 DIAGNOSIS — I10 Essential (primary) hypertension: Secondary | ICD-10-CM | POA: Diagnosis not present

## 2017-09-22 DIAGNOSIS — Z713 Dietary counseling and surveillance: Secondary | ICD-10-CM | POA: Diagnosis not present

## 2017-09-22 DIAGNOSIS — I10 Essential (primary) hypertension: Secondary | ICD-10-CM | POA: Diagnosis not present

## 2017-09-22 DIAGNOSIS — Z299 Encounter for prophylactic measures, unspecified: Secondary | ICD-10-CM | POA: Diagnosis not present

## 2017-09-22 DIAGNOSIS — E1165 Type 2 diabetes mellitus with hyperglycemia: Secondary | ICD-10-CM | POA: Diagnosis not present

## 2017-09-22 DIAGNOSIS — Z6825 Body mass index (BMI) 25.0-25.9, adult: Secondary | ICD-10-CM | POA: Diagnosis not present

## 2017-11-03 DIAGNOSIS — Z1339 Encounter for screening examination for other mental health and behavioral disorders: Secondary | ICD-10-CM | POA: Diagnosis not present

## 2017-11-03 DIAGNOSIS — Z6825 Body mass index (BMI) 25.0-25.9, adult: Secondary | ICD-10-CM | POA: Diagnosis not present

## 2017-11-03 DIAGNOSIS — Z0000 Encounter for general adult medical examination without abnormal findings: Secondary | ICD-10-CM | POA: Diagnosis not present

## 2017-11-03 DIAGNOSIS — Z1331 Encounter for screening for depression: Secondary | ICD-10-CM | POA: Diagnosis not present

## 2017-11-03 DIAGNOSIS — Z7189 Other specified counseling: Secondary | ICD-10-CM | POA: Diagnosis not present

## 2017-11-03 DIAGNOSIS — R5383 Other fatigue: Secondary | ICD-10-CM | POA: Diagnosis not present

## 2017-11-03 DIAGNOSIS — Z299 Encounter for prophylactic measures, unspecified: Secondary | ICD-10-CM | POA: Diagnosis not present

## 2017-11-03 DIAGNOSIS — I10 Essential (primary) hypertension: Secondary | ICD-10-CM | POA: Diagnosis not present

## 2017-11-03 DIAGNOSIS — Z1211 Encounter for screening for malignant neoplasm of colon: Secondary | ICD-10-CM | POA: Diagnosis not present

## 2017-11-03 DIAGNOSIS — Z79899 Other long term (current) drug therapy: Secondary | ICD-10-CM | POA: Diagnosis not present

## 2017-11-03 DIAGNOSIS — E1165 Type 2 diabetes mellitus with hyperglycemia: Secondary | ICD-10-CM | POA: Diagnosis not present

## 2017-12-29 DIAGNOSIS — Z6825 Body mass index (BMI) 25.0-25.9, adult: Secondary | ICD-10-CM | POA: Diagnosis not present

## 2017-12-29 DIAGNOSIS — I10 Essential (primary) hypertension: Secondary | ICD-10-CM | POA: Diagnosis not present

## 2017-12-29 DIAGNOSIS — Z299 Encounter for prophylactic measures, unspecified: Secondary | ICD-10-CM | POA: Diagnosis not present

## 2017-12-29 DIAGNOSIS — E1165 Type 2 diabetes mellitus with hyperglycemia: Secondary | ICD-10-CM | POA: Diagnosis not present

## 2018-08-20 ENCOUNTER — Ambulatory Visit (INDEPENDENT_AMBULATORY_CARE_PROVIDER_SITE_OTHER): Payer: Medicare Other | Admitting: Otolaryngology

## 2018-08-20 DIAGNOSIS — R42 Dizziness and giddiness: Secondary | ICD-10-CM

## 2019-03-18 IMAGING — CR DG TIBIA/FIBULA 2V*L*
1 series · 2 of 2 positions shown · non-contrast
Comparison: None.

CLINICAL DATA: Left lower leg pain for several days

EXAM:
LEFT TIBIA AND FIBULA - 2 VIEW

[Series 1: ap · 0.17mm/px · 2 of 2 slices shown]
[im 1/2]
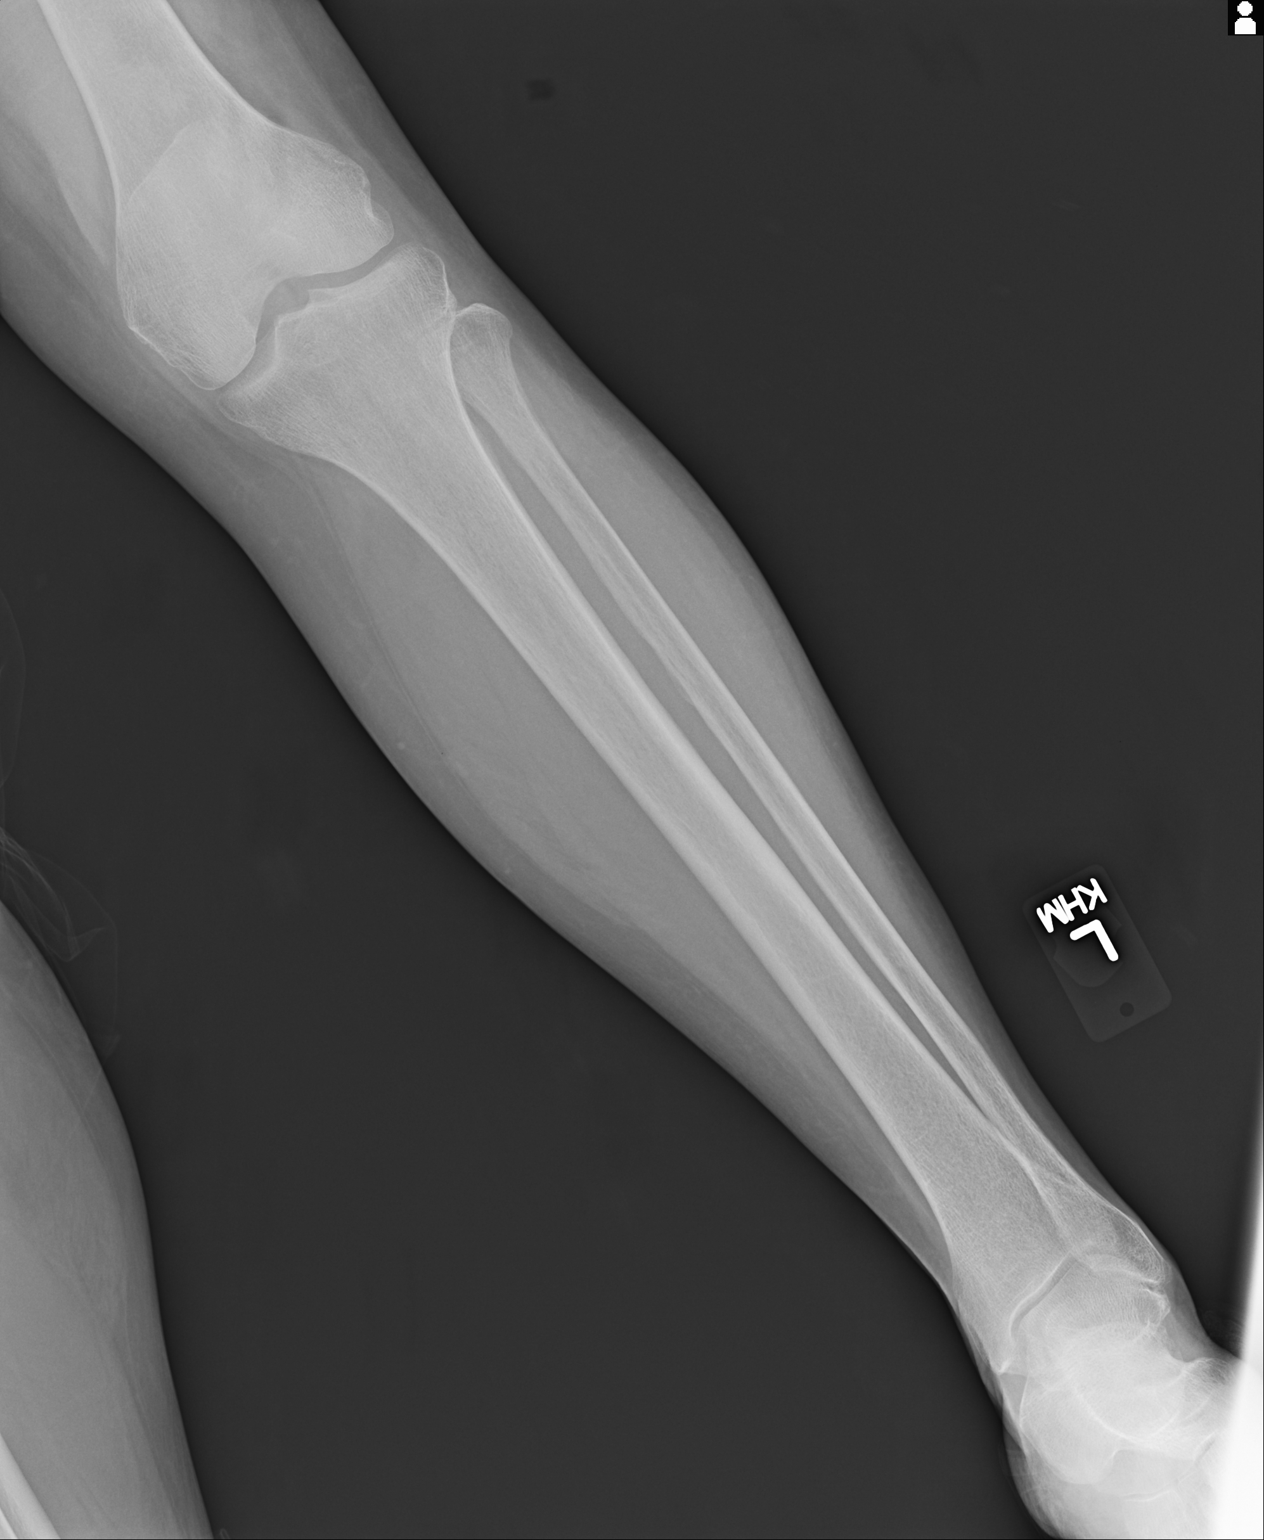
[im 2/2]
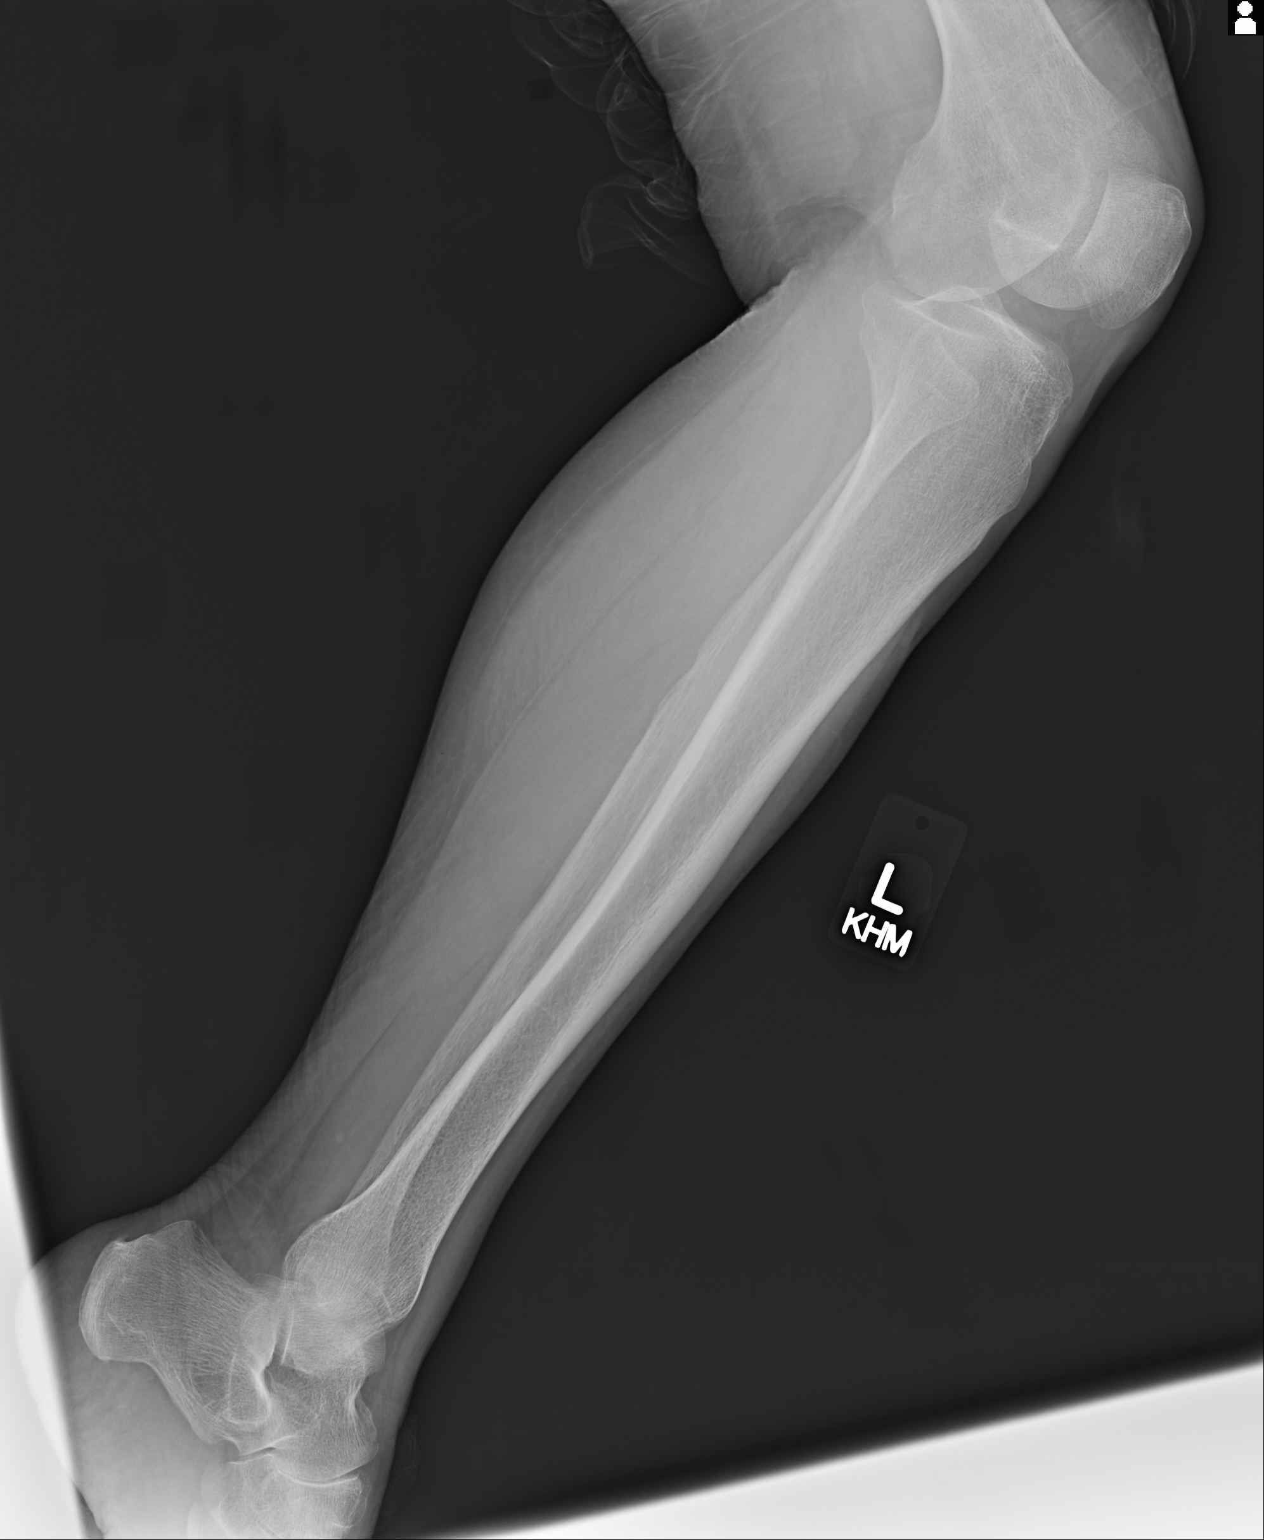

[2 of 2 positions shown; findings below may reference images not displayed]

FINDINGS: There is no evidence of fracture or other focal bone lesions. Soft
tissues are unremarkable.
IMPRESSION: No acute abnormality noted.

## 2019-03-18 IMAGING — CT CT L SPINE W/O CM
3 of 4 series · 12 of 33 positions shown, 14 images · non-contrast
Comparison: None.

CLINICAL DATA: LEFT leg pain and weakness for several days. History
of osteoporosis.

EXAM:
CT LUMBAR SPINE WITHOUT CONTRAST
TECHNIQUE: Multidetector CT imaging of the lumbar spine was performed without
intravenous contrast administration. Multiplanar CT image
reconstructions were also generated.

[Series 4: l spine soft · axial · 0.28mm/px · z∈[-85,+67]mm · 4 of 114 slices shown, 5 images]
[im 19/114  soft-tissue]
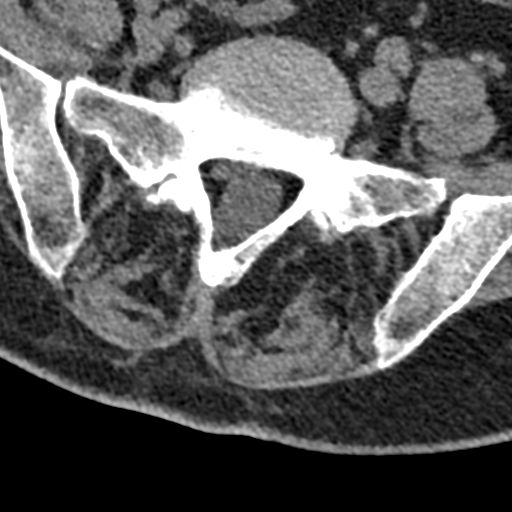
[im 19/114  bone]
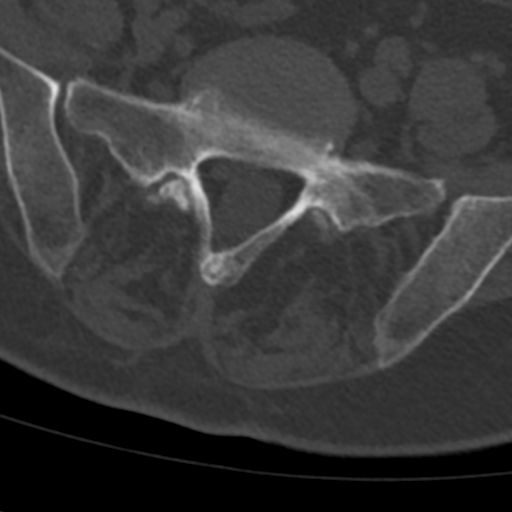
[im 38/114  bone]
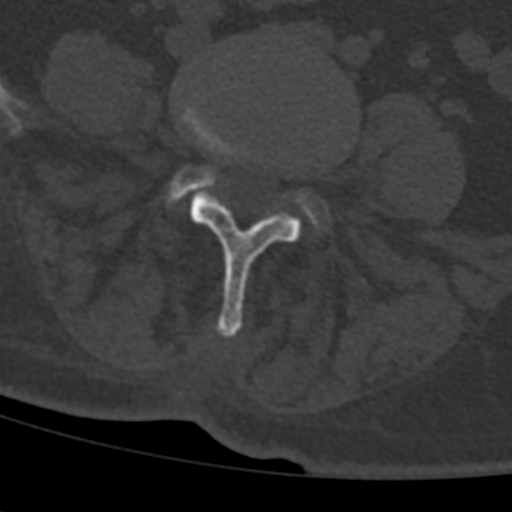
[im 76/114  bone]
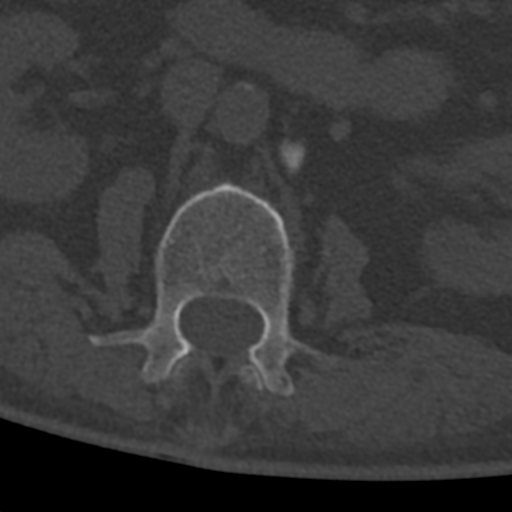
[im 95/114  bone]
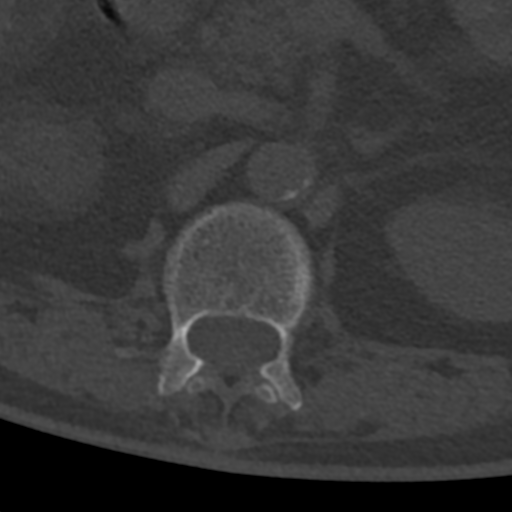

[Series 5: sagittal bone · sagittal · 0.33mm/px · 5 of 71 slices shown, 6 images]
[im 24/71  bone]
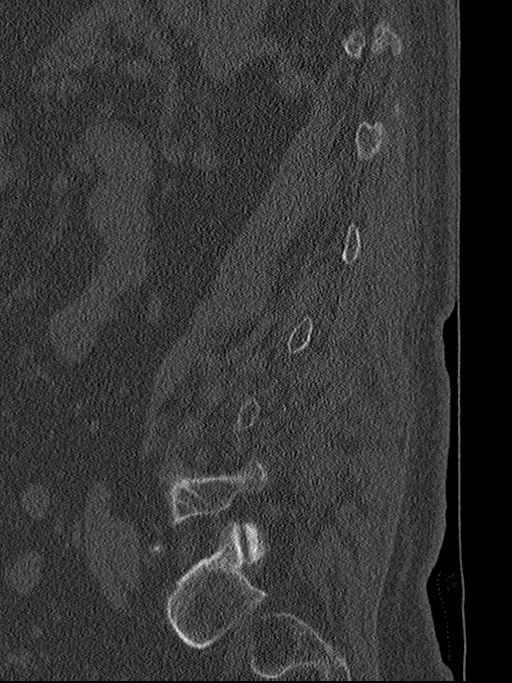
[im 30/71  bone]
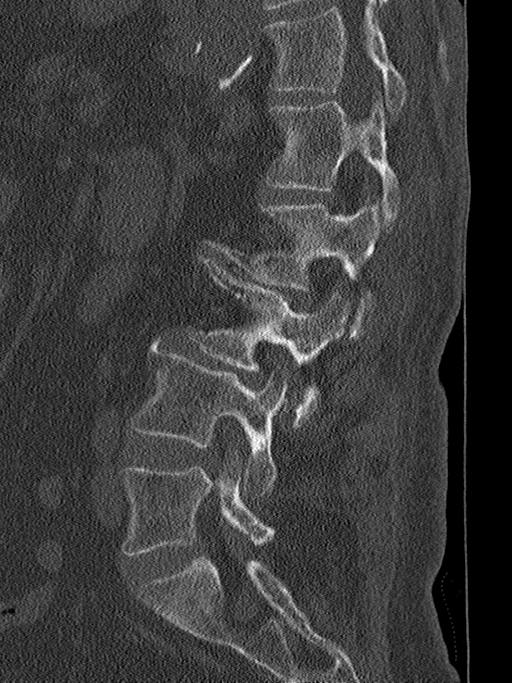
[im 36/71  soft-tissue]
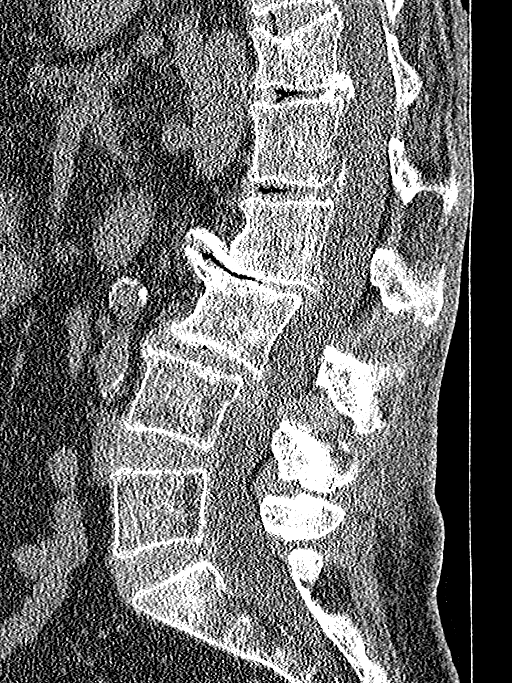
[im 36/71  bone]
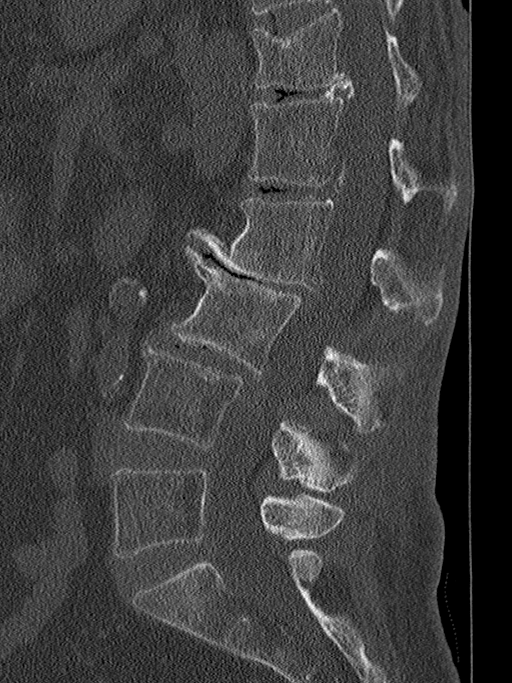
[im 41/71  bone]
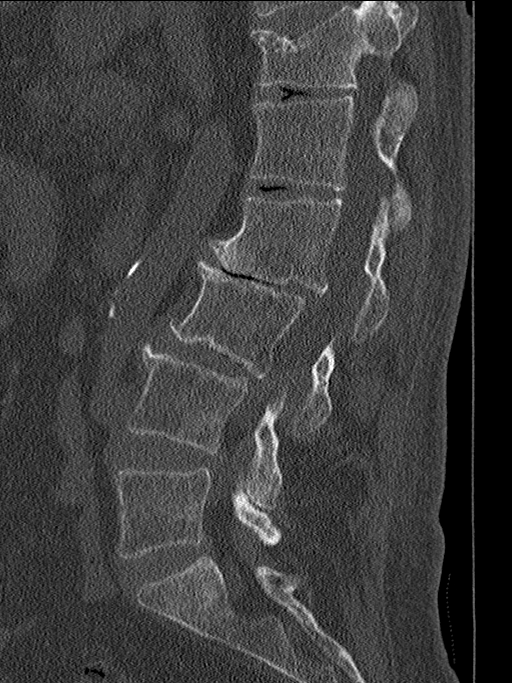
[im 47/71  bone]
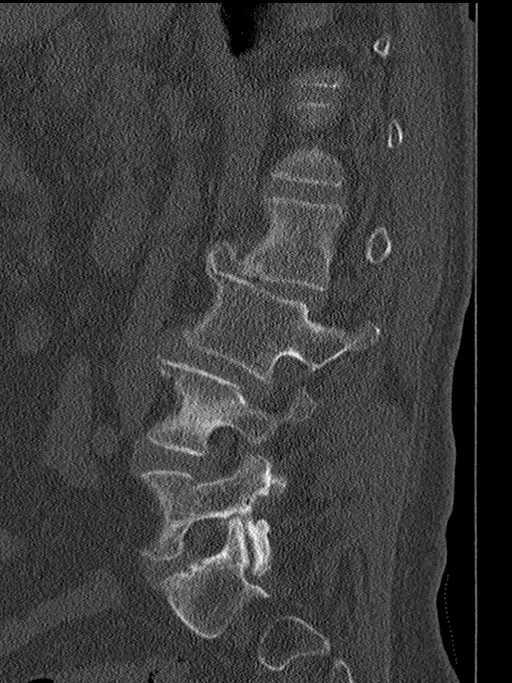

[Series 6: coronal bone · coronal · 0.33mm/px · 3 of 57 slices shown]
[im 12/57  bone]
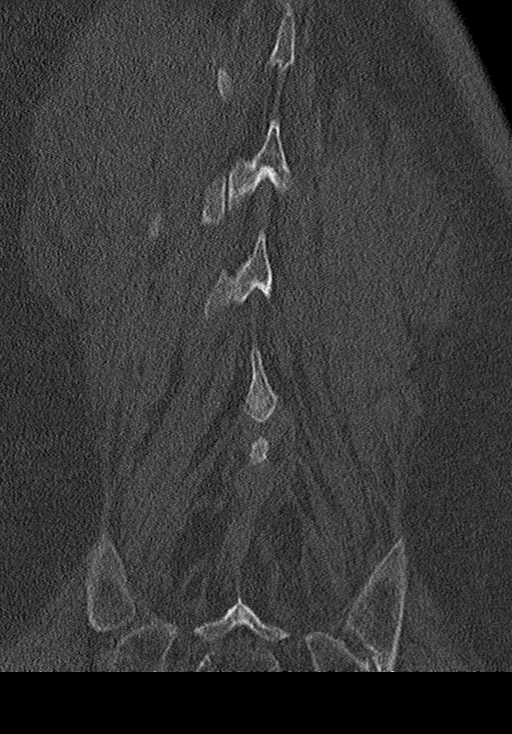
[im 23/57  bone]
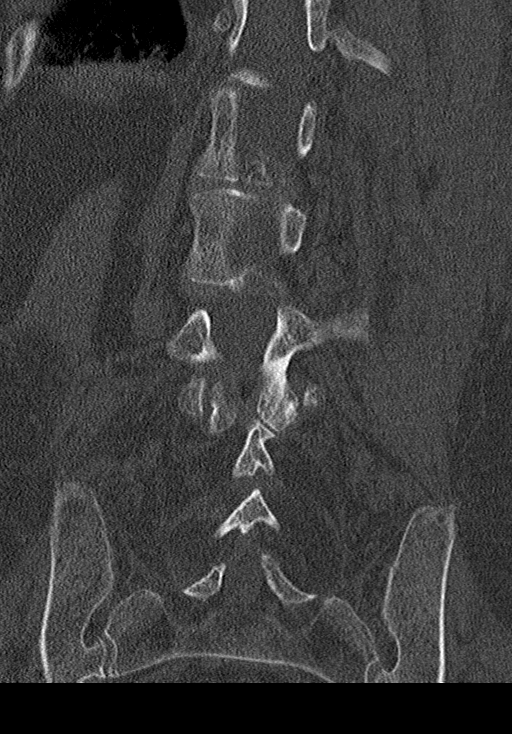
[im 34/57  bone]
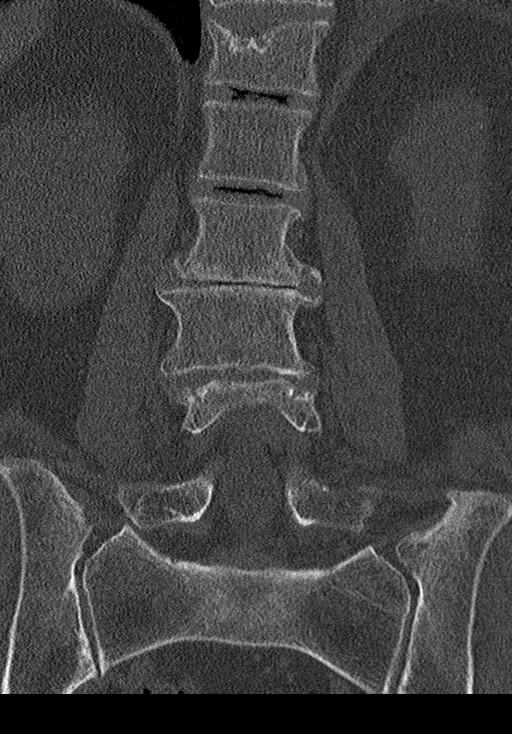

[12 of 33 positions shown; findings below may reference images not displayed]

FINDINGS: SEGMENTATION: For the purposes of this report the last well-formed
intervertebral disc space is reported as L5-S1.

ALIGNMENT: Maintained lumbar lordosis. Grade 1 L2-3 retrolisthesis,
minimal grade 1 L3-4 retrolisthesis. No spondylolysis.

VERTEBRAE: Vertebral bodies and posterior elements are intact. Old
T12 moderate compression fracture superior endplate Schmorl's node.
Severe L2-3 disc height loss with vacuum disc and bulky ventral
endplate spurring, consistent with degenerative disc. Moderate L3-4,
mild T12-L1 and L1-2 degenerative discs with vacuum phenomena.
Osteopenia without destructive bony lesions.

PARASPINAL AND OTHER SOFT TISSUES: Small LEFT pleural effusion. Mild
calcific atherosclerosis aortoiliac vessels. 11 mm LEFT interpolar
cyst.

DISC LEVELS:

T12-L1: Small broad-based disc osteophyte complex without canal
stenosis or neural foraminal narrowing.

L1-2: Annular bulging, no canal stenosis or neural foraminal
narrowing.

L2-3: Retrolisthesis. Small broad-based disc osteophyte complex,
mild facet arthropathy and ligamentum flavum redundancy. Minimal
canal stenosis. No neural foraminal narrowing.

L3-4: Retrolisthesis. Moderate broad-based disc bulge, superimposed
suspected isodense 10 x 8 mm contiguous disc extrusion. Moderate
facet arthropathy and ligamentum flavum redundancy. Moderate canal
stenosis with narrowed LEFT lateral recess which could affect the
traversing LEFT L4 nerve. Mild bilateral neural foraminal narrowing.

L4-5: Small broad-based disc bulge, moderate facet arthropathy and
ligamentum flavum redundancy without canal stenosis or neural
foraminal narrowing.

L5-S1: Small broad-based disc bulge, mild to moderate facet
arthropathy and ligamentum flavum redundancy without canal stenosis
or neural foraminal narrowing.
IMPRESSION: 1. 10 x 8 mm suspected L3-4 disc extrusion resulting in traversing
LEFT L4 nerve impingement which could be confirmed with MRI.
2. Grade 1 L2-3 retrolisthesis and grade 1 L3-4 retrolisthesis on
degenerative basis.
3. Degenerative change of the lumbar spine. Moderate canal stenosis
L3-4.
4. Mild L3-4 neural foraminal narrowing.

Aortic Atherosclerosis ([id number]).

## 2019-04-28 DIAGNOSIS — Z299 Encounter for prophylactic measures, unspecified: Secondary | ICD-10-CM | POA: Diagnosis not present

## 2019-04-28 DIAGNOSIS — Z789 Other specified health status: Secondary | ICD-10-CM | POA: Diagnosis not present

## 2019-04-28 DIAGNOSIS — R809 Proteinuria, unspecified: Secondary | ICD-10-CM | POA: Diagnosis not present

## 2019-04-28 DIAGNOSIS — I10 Essential (primary) hypertension: Secondary | ICD-10-CM | POA: Diagnosis not present

## 2019-04-28 DIAGNOSIS — E1129 Type 2 diabetes mellitus with other diabetic kidney complication: Secondary | ICD-10-CM | POA: Diagnosis not present

## 2019-04-28 DIAGNOSIS — E1165 Type 2 diabetes mellitus with hyperglycemia: Secondary | ICD-10-CM | POA: Diagnosis not present

## 2019-05-04 DIAGNOSIS — E1159 Type 2 diabetes mellitus with other circulatory complications: Secondary | ICD-10-CM | POA: Diagnosis not present

## 2019-05-04 DIAGNOSIS — E1140 Type 2 diabetes mellitus with diabetic neuropathy, unspecified: Secondary | ICD-10-CM | POA: Diagnosis not present

## 2019-05-18 DIAGNOSIS — Z299 Encounter for prophylactic measures, unspecified: Secondary | ICD-10-CM | POA: Diagnosis not present

## 2019-05-18 DIAGNOSIS — I10 Essential (primary) hypertension: Secondary | ICD-10-CM | POA: Diagnosis not present

## 2019-05-18 DIAGNOSIS — E1165 Type 2 diabetes mellitus with hyperglycemia: Secondary | ICD-10-CM | POA: Diagnosis not present

## 2019-08-10 DIAGNOSIS — I10 Essential (primary) hypertension: Secondary | ICD-10-CM | POA: Diagnosis not present

## 2019-08-10 DIAGNOSIS — Z299 Encounter for prophylactic measures, unspecified: Secondary | ICD-10-CM | POA: Diagnosis not present

## 2019-08-10 DIAGNOSIS — E1165 Type 2 diabetes mellitus with hyperglycemia: Secondary | ICD-10-CM | POA: Diagnosis not present

## 2019-09-23 DIAGNOSIS — H9122 Sudden idiopathic hearing loss, left ear: Secondary | ICD-10-CM | POA: Diagnosis not present

## 2019-09-23 DIAGNOSIS — H9312 Tinnitus, left ear: Secondary | ICD-10-CM | POA: Diagnosis not present

## 2019-11-17 DIAGNOSIS — Z299 Encounter for prophylactic measures, unspecified: Secondary | ICD-10-CM | POA: Diagnosis not present

## 2019-11-17 DIAGNOSIS — I10 Essential (primary) hypertension: Secondary | ICD-10-CM | POA: Diagnosis not present

## 2019-11-17 DIAGNOSIS — E1165 Type 2 diabetes mellitus with hyperglycemia: Secondary | ICD-10-CM | POA: Diagnosis not present

## 2019-12-01 DIAGNOSIS — Z299 Encounter for prophylactic measures, unspecified: Secondary | ICD-10-CM | POA: Diagnosis not present

## 2019-12-01 DIAGNOSIS — I10 Essential (primary) hypertension: Secondary | ICD-10-CM | POA: Diagnosis not present

## 2019-12-01 DIAGNOSIS — R350 Frequency of micturition: Secondary | ICD-10-CM | POA: Diagnosis not present

## 2019-12-01 DIAGNOSIS — N390 Urinary tract infection, site not specified: Secondary | ICD-10-CM | POA: Diagnosis not present

## 2019-12-01 DIAGNOSIS — E1165 Type 2 diabetes mellitus with hyperglycemia: Secondary | ICD-10-CM | POA: Diagnosis not present

## 2020-01-03 DIAGNOSIS — I10 Essential (primary) hypertension: Secondary | ICD-10-CM | POA: Diagnosis not present

## 2020-01-03 DIAGNOSIS — Z23 Encounter for immunization: Secondary | ICD-10-CM | POA: Diagnosis not present

## 2020-01-03 DIAGNOSIS — Z299 Encounter for prophylactic measures, unspecified: Secondary | ICD-10-CM | POA: Diagnosis not present

## 2020-01-03 DIAGNOSIS — R5383 Other fatigue: Secondary | ICD-10-CM | POA: Diagnosis not present

## 2020-01-03 DIAGNOSIS — Z0000 Encounter for general adult medical examination without abnormal findings: Secondary | ICD-10-CM | POA: Diagnosis not present

## 2020-01-03 DIAGNOSIS — Z7189 Other specified counseling: Secondary | ICD-10-CM | POA: Diagnosis not present

## 2020-01-03 DIAGNOSIS — Z6824 Body mass index (BMI) 24.0-24.9, adult: Secondary | ICD-10-CM | POA: Diagnosis not present

## 2020-01-03 DIAGNOSIS — E1165 Type 2 diabetes mellitus with hyperglycemia: Secondary | ICD-10-CM | POA: Diagnosis not present

## 2020-03-01 DIAGNOSIS — Z299 Encounter for prophylactic measures, unspecified: Secondary | ICD-10-CM | POA: Diagnosis not present

## 2020-03-01 DIAGNOSIS — I10 Essential (primary) hypertension: Secondary | ICD-10-CM | POA: Diagnosis not present

## 2020-03-01 DIAGNOSIS — Z789 Other specified health status: Secondary | ICD-10-CM | POA: Diagnosis not present

## 2020-03-01 DIAGNOSIS — E1165 Type 2 diabetes mellitus with hyperglycemia: Secondary | ICD-10-CM | POA: Diagnosis not present

## 2020-05-30 DIAGNOSIS — I10 Essential (primary) hypertension: Secondary | ICD-10-CM | POA: Diagnosis not present

## 2020-05-30 DIAGNOSIS — E1129 Type 2 diabetes mellitus with other diabetic kidney complication: Secondary | ICD-10-CM | POA: Diagnosis not present

## 2020-05-30 DIAGNOSIS — Z299 Encounter for prophylactic measures, unspecified: Secondary | ICD-10-CM | POA: Diagnosis not present

## 2020-05-30 DIAGNOSIS — R809 Proteinuria, unspecified: Secondary | ICD-10-CM | POA: Diagnosis not present

## 2020-05-30 DIAGNOSIS — J329 Chronic sinusitis, unspecified: Secondary | ICD-10-CM | POA: Diagnosis not present

## 2020-06-07 DIAGNOSIS — E1165 Type 2 diabetes mellitus with hyperglycemia: Secondary | ICD-10-CM | POA: Diagnosis not present

## 2020-06-07 DIAGNOSIS — Z299 Encounter for prophylactic measures, unspecified: Secondary | ICD-10-CM | POA: Diagnosis not present

## 2020-06-07 DIAGNOSIS — R809 Proteinuria, unspecified: Secondary | ICD-10-CM | POA: Diagnosis not present

## 2020-06-07 DIAGNOSIS — E1129 Type 2 diabetes mellitus with other diabetic kidney complication: Secondary | ICD-10-CM | POA: Diagnosis not present

## 2020-06-07 DIAGNOSIS — I10 Essential (primary) hypertension: Secondary | ICD-10-CM | POA: Diagnosis not present

## 2020-06-14 DIAGNOSIS — H524 Presbyopia: Secondary | ICD-10-CM | POA: Diagnosis not present

## 2020-06-14 DIAGNOSIS — H35033 Hypertensive retinopathy, bilateral: Secondary | ICD-10-CM | POA: Diagnosis not present

## 2020-06-26 DIAGNOSIS — H01001 Unspecified blepharitis right upper eyelid: Secondary | ICD-10-CM | POA: Diagnosis not present

## 2020-06-26 DIAGNOSIS — H43811 Vitreous degeneration, right eye: Secondary | ICD-10-CM | POA: Diagnosis not present

## 2020-06-26 DIAGNOSIS — H01005 Unspecified blepharitis left lower eyelid: Secondary | ICD-10-CM | POA: Diagnosis not present

## 2020-06-26 DIAGNOSIS — H01002 Unspecified blepharitis right lower eyelid: Secondary | ICD-10-CM | POA: Diagnosis not present

## 2020-06-26 DIAGNOSIS — H02831 Dermatochalasis of right upper eyelid: Secondary | ICD-10-CM | POA: Diagnosis not present

## 2020-06-26 DIAGNOSIS — H01004 Unspecified blepharitis left upper eyelid: Secondary | ICD-10-CM | POA: Diagnosis not present

## 2020-06-26 DIAGNOSIS — H25813 Combined forms of age-related cataract, bilateral: Secondary | ICD-10-CM | POA: Diagnosis not present

## 2020-06-26 DIAGNOSIS — H02834 Dermatochalasis of left upper eyelid: Secondary | ICD-10-CM | POA: Diagnosis not present

## 2020-08-29 NOTE — H&P (Signed)
Surgical History & Physical  Patient Name: Lacey Snyder DOB: 01/08/1941  Surgery: Cataract extraction with intraocular lens implant phacoemulsification; Right Eye  Surgeon: Baruch Goldmann MD Surgery Date:  09/08/2020 Pre-Op Date:  08/29/2020  HPI: A 37 Yr. old female patient Pt is referred by Dr. Rosana Hoes for cataract evaluation The patient complains of cloudy vision, which began 1 year ago. Both eyes are affected. OD>OS. The condition's severity is worsening. Pt wearing otc readers prn. Pt states she has some difficulty seeing small print. Pt denies any increase in floaters/flashes of light. Symptoms are negatively affecting pt's quality of life. Pt using AT's prn OU. HPI Completed by Dr. Baruch Goldmann  Medical History: Cataracts PVD OU Diabetes High Blood Pressure  Review of Systems Negative Allergic/Immunologic Negative Cardiovascular Negative Constitutional Negative Ear, Nose, Mouth & Throat Negative Endocrine Negative Eyes Negative Gastrointestinal Negative Genitourinary Negative Hemotologic/Lymphatic Negative Integumentary Negative Musculoskeletal Negative Neurological Negative Psychiatry Negative Respiratory  Social   Never smoked   Medication Cinnamon, Fish Oil, Multivitamin, Probiotic, Metformin, Lisinopril,   Sx/Procedures Back Surgery,   Drug Allergies   NKDA  History & Physical: Heent:  Cataract, Right eye NECK: supple without bruits LUNGS: lungs clear to auscultation CV: regular rate and rhythm Abdomen: soft and non-tender  Impression & Plan: Assessment: 1.  COMBINED FORMS AGE RELATED CATARACT; Both Eyes (H25.813) 2.  BLEPHARITIS; Right Upper Lid, Right Lower Lid, Left Upper Lid, Left Lower Lid (H01.001, H01.002,H01.004,H01.005) 3.  DERMATOCHALASIS; Right Upper Lid, Left Upper Lid (H02.831, H02.834) 4.  VITREOUS DETACHMENT PVD; Right Eye [phone])  Plan: 1.  Cataract accounts for the patient's decreased vision. This visual impairment is not  correctable with a tolerable change in glasses or contact lenses. Cataract surgery with an implantation of a new lens should significantly improve the visual and functional status of the patient. Discussed all risks, benefits, alternatives, and potential complications. Discussed the procedures and recovery. Patient desires to have surgery. A-scan ordered and performed today for intra-ocular lens calculations. The surgery will be performed in order to improve vision for driving, reading, and for eye examinations. Recommend phacoemulsification with intra-ocular lens. Recommend Dextenza for post-operative pain and inflammation.  Right Eye worse - first. Dilates poorly - shugacaine by protocol. Vision [place]. Malyugin Ring. Omidira. Discussed Vivity - declines for now.  2.  Recommend regular cleaning.  3.  Asymptomatic, recommend observation for now. Findings, prognosis and treatment options reviewed.  4.  Old Asymptomatic. RD precautions given. Patient to call with increase in flashing lights/floaters/dark curtain. Symptomatic.

## 2020-09-04 DIAGNOSIS — H25811 Combined forms of age-related cataract, right eye: Secondary | ICD-10-CM | POA: Diagnosis not present

## 2020-09-05 ENCOUNTER — Encounter (HOSPITAL_COMMUNITY)
Admission: RE | Admit: 2020-09-05 | Discharge: 2020-09-05 | Disposition: A | Discharge status: Home / Self Care | Payer: Medicare HMO | Source: Ambulatory Visit | Attending: Ophthalmology | Admitting: Ophthalmology

## 2020-09-05 ENCOUNTER — Other Ambulatory Visit: Payer: Self-pay

## 2020-09-05 ENCOUNTER — Encounter (HOSPITAL_COMMUNITY): Payer: Self-pay

## 2020-09-06 ENCOUNTER — Other Ambulatory Visit (HOSPITAL_COMMUNITY): Payer: Medicare Other

## 2020-09-08 ENCOUNTER — Ambulatory Visit (HOSPITAL_COMMUNITY): Payer: Medicare HMO | Admitting: Certified Registered"

## 2020-09-08 ENCOUNTER — Encounter (HOSPITAL_COMMUNITY)
Admission: RE | Disposition: A | Discharge status: Home / Self Care | Payer: Self-pay | Source: Home / Self Care | Attending: Ophthalmology

## 2020-09-08 ENCOUNTER — Other Ambulatory Visit: Payer: Self-pay

## 2020-09-08 ENCOUNTER — Encounter (HOSPITAL_COMMUNITY): Payer: Self-pay | Admitting: Ophthalmology

## 2020-09-08 ENCOUNTER — Ambulatory Visit (HOSPITAL_COMMUNITY)
Admission: RE | Admit: 2020-09-08 | Discharge: 2020-09-08 | Disposition: A | Discharge status: Home / Self Care | Payer: Medicare HMO | Attending: Ophthalmology | Admitting: Ophthalmology

## 2020-09-08 DIAGNOSIS — E1136 Type 2 diabetes mellitus with diabetic cataract: Secondary | ICD-10-CM | POA: Diagnosis not present

## 2020-09-08 DIAGNOSIS — H43811 Vitreous degeneration, right eye: Secondary | ICD-10-CM | POA: Diagnosis not present

## 2020-09-08 DIAGNOSIS — H25813 Combined forms of age-related cataract, bilateral: Secondary | ICD-10-CM | POA: Insufficient documentation

## 2020-09-08 DIAGNOSIS — H02834 Dermatochalasis of left upper eyelid: Secondary | ICD-10-CM | POA: Diagnosis not present

## 2020-09-08 DIAGNOSIS — H0100B Unspecified blepharitis left eye, upper and lower eyelids: Secondary | ICD-10-CM | POA: Diagnosis not present

## 2020-09-08 DIAGNOSIS — H25811 Combined forms of age-related cataract, right eye: Secondary | ICD-10-CM | POA: Diagnosis not present

## 2020-09-08 DIAGNOSIS — H0100A Unspecified blepharitis right eye, upper and lower eyelids: Secondary | ICD-10-CM | POA: Diagnosis not present

## 2020-09-08 DIAGNOSIS — H02831 Dermatochalasis of right upper eyelid: Secondary | ICD-10-CM | POA: Diagnosis not present

## 2020-09-08 DIAGNOSIS — H2511 Age-related nuclear cataract, right eye: Secondary | ICD-10-CM | POA: Diagnosis not present

## 2020-09-08 HISTORY — PX: CATARACT EXTRACTION W/PHACO: SHX586

## 2020-09-08 LAB — GLUCOSE, CAPILLARY: Glucose-Capillary: 147 mg/dL — ABNORMAL HIGH (ref 70–99)

## 2020-09-08 SURGERY — PHACOEMULSIFICATION, CATARACT, WITH IOL INSERTION
Anesthesia: Monitor Anesthesia Care | Site: Eye | Laterality: Right

## 2020-09-08 MED ORDER — POVIDONE-IODINE 5 % OP SOLN
OPHTHALMIC | Status: DC | PRN
  Administered 2020-09-08: 1 via OPHTHALMIC

## 2020-09-08 MED ORDER — TETRACAINE HCL 0.5 % OP SOLN
1.0000 [drp] | OPHTHALMIC | Status: AC | PRN
  Administered 2020-09-08 (×3): 1 [drp] via OPHTHALMIC

## 2020-09-08 MED ORDER — LIDOCAINE HCL 3.5 % OP GEL
1.0000 "application " | Freq: Once | OPHTHALMIC | Status: AC
  Administered 2020-09-08: 1 via OPHTHALMIC

## 2020-09-08 MED ORDER — SODIUM HYALURONATE 23MG/ML IO SOSY
PREFILLED_SYRINGE | INTRAOCULAR | Status: DC | PRN
  Administered 2020-09-08: 0.6 mL via INTRAOCULAR

## 2020-09-08 MED ORDER — LIDOCAINE HCL (PF) 1 % IJ SOLN
INTRAOCULAR | Status: DC | PRN
  Administered 2020-09-08: 1 mL via OPHTHALMIC

## 2020-09-08 MED ORDER — PHENYLEPHRINE HCL 2.5 % OP SOLN
1.0000 [drp] | OPHTHALMIC | Status: AC | PRN
  Administered 2020-09-08 (×3): 1 [drp] via OPHTHALMIC

## 2020-09-08 MED ORDER — TRYPAN BLUE 0.06 % OP SOLN
OPHTHALMIC | Status: DC | PRN
  Administered 2020-09-08: 0.5 mL via INTRAOCULAR

## 2020-09-08 MED ORDER — STERILE WATER FOR IRRIGATION IR SOLN
Status: DC | PRN
  Administered 2020-09-08: 250 mL

## 2020-09-08 MED ORDER — NEOMYCIN-POLYMYXIN-DEXAMETH 3.5-10000-0.1 OP SUSP
OPHTHALMIC | Status: DC | PRN
  Administered 2020-09-08: 1 [drp] via OPHTHALMIC

## 2020-09-08 MED ORDER — EPINEPHRINE PF 1 MG/ML IJ SOLN
INTRAOCULAR | Status: DC | PRN
  Administered 2020-09-08: 500 mL

## 2020-09-08 MED ORDER — BSS IO SOLN
INTRAOCULAR | Status: DC | PRN
  Administered 2020-09-08: 15 mL

## 2020-09-08 MED ORDER — SODIUM HYALURONATE 10 MG/ML IO SOLUTION
PREFILLED_SYRINGE | INTRAOCULAR | Status: DC | PRN
  Administered 2020-09-08: 0.85 mL via INTRAOCULAR

## 2020-09-08 MED ORDER — TROPICAMIDE 1 % OP SOLN
1.0000 [drp] | OPHTHALMIC | Status: AC
  Administered 2020-09-08 (×3): 1 [drp] via OPHTHALMIC

## 2020-09-08 SURGICAL SUPPLY — 14 items
0.3mm Polymer I/A Bimanual Set IMPLANT
CLOTH BEACON ORANGE TIMEOUT ST (SAFETY) ×2 IMPLANT
DRAPE HALF SHEET 40X57 (DRAPES) ×2 IMPLANT
EYE SHIELD UNIVERSAL CLEAR (GAUZE/BANDAGES/DRESSINGS) ×2 IMPLANT
GLOVE SURG UNDER POLY LF SZ6.5 (GLOVE) ×2 IMPLANT
GLOVE SURG UNDER POLY LF SZ7 (GLOVE) ×2 IMPLANT
GOWN STRL REUS W/TWL LRG LVL3 (GOWN DISPOSABLE) ×2 IMPLANT
MARKER SKIN DUAL TIP RULER LAB (MISCELLANEOUS) ×2 IMPLANT
NEEDLE HYPO 18GX1.5 BLUNT FILL (NEEDLE) ×2 IMPLANT
PAD ARMBOARD 7.5X6 YLW CONV (MISCELLANEOUS) ×2 IMPLANT
TAPE SURG TRANSPORE 1 IN (GAUZE/BANDAGES/DRESSINGS) ×1 IMPLANT
TAPE SURGICAL TRANSPORE 1 IN (GAUZE/BANDAGES/DRESSINGS) ×1
Tecnis 1 piece IOL (Intraocular Lens) ×2 IMPLANT
WATER STERILE IRR 250ML POUR (IV SOLUTION) ×2 IMPLANT

## 2020-09-08 NOTE — Anesthesia Preprocedure Evaluation (Signed)
Anesthesia Evaluation  Patient identified by MRN, date of birth, ID band Patient awake    Reviewed: Allergy & Precautions, H&P , NPO status , Patient's Chart, lab work & pertinent test results, reviewed documented beta blocker date and time   Airway Mallampati: II  TM Distance: >3 FB Neck ROM: full    Dental no notable dental hx.    Pulmonary neg pulmonary ROS,    Pulmonary exam normal breath sounds clear to auscultation       Cardiovascular Exercise Tolerance: Good hypertension, negative cardio ROS   Rhythm:regular Rate:Normal     Neuro/Psych negative neurological ROS  negative psych ROS   GI/Hepatic negative GI ROS, Neg liver ROS,   Endo/Other  negative endocrine ROSdiabetes  Renal/GU negative Renal ROS  negative genitourinary   Musculoskeletal   Abdominal   Peds  Hematology negative hematology ROS (+)   Anesthesia Other Findings   Reproductive/Obstetrics negative OB ROS                             Anesthesia Physical Anesthesia Plan  ASA: 2  Anesthesia Plan: MAC   Post-op Pain Management:    Induction:   PONV Risk Score and Plan:   Airway Management Planned:   Additional Equipment:   Intra-op Plan:   Post-operative Plan:   Informed Consent: I have reviewed the patients History and Physical, chart, labs and discussed the procedure including the risks, benefits and alternatives for the proposed anesthesia with the patient or authorized representative who has indicated his/her understanding and acceptance.     Dental Advisory Given  Plan Discussed with: CRNA  Anesthesia Plan Comments:         Anesthesia Quick Evaluation

## 2020-09-08 NOTE — Anesthesia Postprocedure Evaluation (Signed)
Anesthesia Post Note  Patient: Lacey Snyder  Procedure(s) Performed: CATARACT EXTRACTION PHACO AND INTRAOCULAR LENS PLACEMENT RIGHT EYE (Right: Eye)  Patient location during evaluation: Phase II Anesthesia Type: MAC Level of consciousness: awake Pain management: pain level controlled Vital Signs Assessment: post-procedure vital signs reviewed and stable Respiratory status: spontaneous breathing and respiratory function stable Cardiovascular status: blood pressure returned to baseline and stable Postop Assessment: no headache and no apparent nausea or vomiting Anesthetic complications: no Comments: Late entry   No notable events documented.   Last Vitals:  Vitals:   09/08/20 0756 09/08/20 0858  BP: (!) 135/105 (!) 175/64  Pulse: 72 67  Resp: 16 17  Temp: 36.4 C 36.5 C  SpO2: 97% 98%    Last Pain:  Vitals:   09/08/20 0858  TempSrc: Oral  PainSc: 0-No pain                 Louann Sjogren

## 2020-09-08 NOTE — Interval H&P Note (Signed)
History and Physical Interval Note:  09/08/2020 8:22 AM  Elberta Spaniel  has presented today for surgery, with the diagnosis of Nuclear sclerotic cataract - Right eye.  The various methods of treatment have been discussed with the patient and family. After consideration of risks, benefits and other options for treatment, the patient has consented to  Procedure(s) with comments: CATARACT EXTRACTION PHACO AND INTRAOCULAR LENS PLACEMENT RIGHT EYE (Right) - right as a surgical intervention.  The patient's history has been reviewed, patient examined, no change in status, stable for surgery.  I have reviewed the patient's chart and labs.  Questions were answered to the patient's satisfaction.     Baruch Goldmann

## 2020-09-08 NOTE — Transfer of Care (Signed)
Immediate Anesthesia Transfer of Care Note  Patient: Lacey Snyder  Procedure(s) Performed: CATARACT EXTRACTION PHACO AND INTRAOCULAR LENS PLACEMENT RIGHT EYE (Right: Eye)  Patient Location: Short Stay  Anesthesia Type:MAC  Level of Consciousness: awake, alert  and oriented  Airway & Oxygen Therapy: Patient Spontanous Breathing  Post-op Assessment: Report given to RN, Post -op Vital signs reviewed and stable and Patient moving all extremities X 4  Post vital signs: Reviewed and stable  Last Vitals:  Vitals Value Taken Time  BP    Temp    Pulse    Resp    SpO2      Last Pain:  Vitals:   09/08/20 0756  TempSrc: Oral  PainSc: 1          Complications: No notable events documented.

## 2020-09-08 NOTE — Op Note (Signed)
Date of procedure: 09/08/20  Pre-operative diagnosis:  Visually significant combined form age-related cataract, Right Eye (H25.811)  Post-operative diagnosis:  Visually significant combined form age-related cataract, Right Eye (H25.811)  Procedure: Removal of cataract via phacoemulsification and insertion of intra-ocular lens Wynetta Emery and [employer] DCB00  +20.5D into the capsular bag of the Right Eye  Attending surgeon: Gerda Diss. Jeanann Balinski, MD, MA  Anesthesia: MAC, Topical Akten  Complications: None  Estimated Blood Loss: <81m (minimal)  Specimens: None  Implants: As above  Indications:  Visually significant age-related cataract, Right Eye  Procedure:  The patient was seen and identified in the pre-operative area. The operative eye was identified and dilated.  The operative eye was marked.  Topical anesthesia was administered to the operative eye.     The patient was then to the operative suite and placed in the supine position.  A timeout was performed confirming the patient, procedure to be performed, and all other relevant information.   The patient's face was prepped and draped in the usual fashion for intra-ocular surgery.  A lid speculum was placed into the operative eye and the surgical microscope moved into place and focused.  A superotemporal paracentesis was created using a 20 gauge paracentesis blade.  Shugarcaine was injected into the anterior chamber.  Viscoelastic was injected into the anterior chamber.  A temporal clear-corneal main wound incision was created using a 2.459mmicrokeratome.  A continuous curvilinear capsulorrhexis was initiated using an irrigating cystitome and completed using capsulorrhexis forceps.  Hydrodissection and hydrodeliniation were performed.  Viscoelastic was injected into the anterior chamber.  A phacoemulsification handpiece and a chopper as a second instrument were used to remove the nucleus and epinucleus. The irrigation/aspiration handpiece was  used to remove any remaining cortical material.   The capsular bag was reinflated with viscoelastic, checked, and found to be intact.  The intraocular lens was inserted into the capsular bag.  The irrigation/aspiration handpiece was used to remove any remaining viscoelastic.  The clear corneal wound and paracentesis wounds were then hydrated and checked with Weck-Cels to be watertight.  The lid-speculum was removed.  The drape was removed.  The patient's face was cleaned with a wet and dry 4x4.   Maxitrol was instilled in the eye. A clear shield was taped over the eye. The patient was taken to the post-operative care unit in good condition, having tolerated the procedure well.  Post-Op Instructions: [facility] and will receive all other orders and instructions.

## 2020-09-08 NOTE — Anesthesia Procedure Notes (Signed)
Procedure Name: MAC Date/Time: 09/08/2020 8:30 AM Performed by: Orlie Dakin, CRNA Pre-anesthesia Checklist: Patient identified, Emergency Drugs available, Suction available and Patient being monitored Patient Re-evaluated:Patient Re-evaluated prior to induction Oxygen Delivery Method: Nasal cannula Placement Confirmation: positive ETCO2

## 2020-09-12 DIAGNOSIS — Z299 Encounter for prophylactic measures, unspecified: Secondary | ICD-10-CM | POA: Diagnosis not present

## 2020-09-12 DIAGNOSIS — E1165 Type 2 diabetes mellitus with hyperglycemia: Secondary | ICD-10-CM | POA: Diagnosis not present

## 2020-09-12 DIAGNOSIS — I10 Essential (primary) hypertension: Secondary | ICD-10-CM | POA: Diagnosis not present

## 2020-09-18 ENCOUNTER — Encounter (HOSPITAL_COMMUNITY): Payer: Self-pay | Admitting: Ophthalmology

## 2020-09-22 ENCOUNTER — Other Ambulatory Visit: Payer: Self-pay

## 2020-09-22 ENCOUNTER — Encounter (HOSPITAL_COMMUNITY)
Admit: 2020-09-22 | Discharge: 2020-09-22 | Disposition: A | Discharge status: Home / Self Care | Payer: Medicare HMO | Attending: Ophthalmology | Admitting: Ophthalmology

## 2020-09-25 DIAGNOSIS — H25812 Combined forms of age-related cataract, left eye: Secondary | ICD-10-CM | POA: Diagnosis not present

## 2020-09-26 NOTE — H&P (Signed)
Surgical History & Physical  Patient Name: Lacey Snyder DOB: [birth year]  Surgery: Cataract extraction with intraocular lens implant phacoemulsification; Left Eye  Surgeon: Baruch Goldmann MD Surgery Date:  09/29/2020 Pre-Op Date:  09/21/2020  HPI: A 36 Yr. old female patient 1. The patient complains of difficulty when cloudy vision, which began 1 year ago. The left eye is affected. The episode is gradual. The condition's severity increased since last visit. Symptoms occur when the patient is inside and outside. 2. The patient is returning after cataract post-op. The right eye is affected. Status post cataract post-op, which began 1 week ago: Since the last visit, the affected area is doing well. The patient's vision is stable. Patient is following medication instructions. HPI Completed by Dr. Baruch Goldmann  Medical History: Cataracts PVD OU Diabetes High Blood Pressure  Review of Systems Negative Allergic/Immunologic Negative Cardiovascular Negative Constitutional Negative Ear, Nose, Mouth & Throat Negative Endocrine Negative Eyes Negative Gastrointestinal Negative Genitourinary Negative Hemotologic/Lymphatic Negative Integumentary Negative Musculoskeletal Negative Neurological Negative Psychiatry Negative Respiratory  Social   Never smoked   Medication Prednisolone-Moxifloxacin-Bromfenac,  Cinnamon, Fish Oil, Multivitamin, Probiotic, Metformin, Lisinopril,   Sx/Procedures Phaco c IOL OD,  Back Surgery,   Drug Allergies   NKDA  History & Physical: Heent:  Cataract, Left eye NECK: supple without bruits LUNGS: lungs clear to auscultation CV: regular rate and rhythm Abdomen: soft and non-tender  Impression & Plan: Assessment: 1.  CATARACT EXTRACTION STATUS; Right Eye (Z98.41) 2.  COMBINED FORMS AGE RELATED CATARACT; Left Eye (H25.812)  Plan: 1.  1 week after cataract surgery. Doing well with improved vision and normal eye pressure. Call with any problems or  concerns. Continue Pred-Moxi-Brom 2x/day for 3 more weeks.  2.  Cataract accounts for the patient's decreased vision. This visual impairment is not correctable with a tolerable change in glasses or contact lenses. Cataract surgery with an implantation of a new lens should significantly improve the visual and functional status of the patient. Discussed all risks, benefits, alternatives, and potential complications. Discussed the procedures and recovery. Patient desires to have surgery. A-scan ordered and performed today for intra-ocular lens calculations. The surgery will be performed in order to improve vision for driving, reading, and for eye examinations. Recommend phacoemulsification with intra-ocular lens. Recommend Dextenza for post-operative pain and inflammation. Left Eye. Surgery required to correct imbalance of vision. Dilates poorly - shugacaine by protocol. Omidira.

## 2020-09-29 ENCOUNTER — Encounter (HOSPITAL_COMMUNITY)
Admission: RE | Disposition: A | Discharge status: Home / Self Care | Payer: Self-pay | Source: Home / Self Care | Attending: Ophthalmology

## 2020-09-29 ENCOUNTER — Ambulatory Visit (HOSPITAL_COMMUNITY)
Admission: RE | Admit: 2020-09-29 | Discharge: 2020-09-29 | Disposition: A | Discharge status: Home / Self Care | Payer: Medicare HMO | Attending: Ophthalmology | Admitting: Ophthalmology

## 2020-09-29 ENCOUNTER — Ambulatory Visit (HOSPITAL_COMMUNITY): Payer: Medicare HMO | Admitting: Certified Registered"

## 2020-09-29 DIAGNOSIS — Z9841 Cataract extraction status, right eye: Secondary | ICD-10-CM | POA: Insufficient documentation

## 2020-09-29 DIAGNOSIS — E1136 Type 2 diabetes mellitus with diabetic cataract: Secondary | ICD-10-CM | POA: Insufficient documentation

## 2020-09-29 DIAGNOSIS — E1151 Type 2 diabetes mellitus with diabetic peripheral angiopathy without gangrene: Secondary | ICD-10-CM | POA: Insufficient documentation

## 2020-09-29 DIAGNOSIS — H25812 Combined forms of age-related cataract, left eye: Secondary | ICD-10-CM | POA: Diagnosis not present

## 2020-09-29 HISTORY — PX: CATARACT EXTRACTION W/PHACO: SHX586

## 2020-09-29 LAB — GLUCOSE, CAPILLARY: Glucose-Capillary: 127 mg/dL — ABNORMAL HIGH (ref 70–99)

## 2020-09-29 SURGERY — PHACOEMULSIFICATION, CATARACT, WITH IOL INSERTION
Anesthesia: Monitor Anesthesia Care | Site: Eye | Laterality: Left

## 2020-09-29 MED ORDER — BSS IO SOLN
INTRAOCULAR | Status: DC | PRN
  Administered 2020-09-29: 15 mL via INTRAOCULAR

## 2020-09-29 MED ORDER — STERILE WATER FOR IRRIGATION IR SOLN
Status: DC | PRN
  Administered 2020-09-29: 250 mL

## 2020-09-29 MED ORDER — POVIDONE-IODINE 5 % OP SOLN
OPHTHALMIC | Status: DC | PRN
  Administered 2020-09-29: 1 via OPHTHALMIC

## 2020-09-29 MED ORDER — LIDOCAINE HCL 3.5 % OP GEL
1.0000 "application " | Freq: Once | OPHTHALMIC | Status: AC
  Administered 2020-09-29: 1 via OPHTHALMIC

## 2020-09-29 MED ORDER — LIDOCAINE HCL (PF) 1 % IJ SOLN
INTRAOCULAR | Status: DC | PRN
  Administered 2020-09-29: 1 mL via OPHTHALMIC

## 2020-09-29 MED ORDER — PHENYLEPHRINE HCL 2.5 % OP SOLN
1.0000 [drp] | OPHTHALMIC | Status: AC | PRN
  Administered 2020-09-29 (×3): 1 [drp] via OPHTHALMIC

## 2020-09-29 MED ORDER — NEOMYCIN-POLYMYXIN-DEXAMETH 3.5-10000-0.1 OP SUSP
OPHTHALMIC | Status: DC | PRN
  Administered 2020-09-29: 1 [drp] via OPHTHALMIC

## 2020-09-29 MED ORDER — PHENYLEPHRINE-KETOROLAC 1-0.3 % IO SOLN
INTRAOCULAR | Status: AC
  Filled 2020-09-29: qty 4

## 2020-09-29 MED ORDER — TRYPAN BLUE 0.06 % OP SOLN
OPHTHALMIC | Status: AC
  Filled 2020-09-29: qty 0.5

## 2020-09-29 MED ORDER — EPINEPHRINE PF 1 MG/ML IJ SOLN
INTRAMUSCULAR | Status: AC
  Filled 2020-09-29: qty 1

## 2020-09-29 MED ORDER — TETRACAINE HCL 0.5 % OP SOLN
1.0000 [drp] | OPHTHALMIC | Status: AC | PRN
  Administered 2020-09-29 (×3): 1 [drp] via OPHTHALMIC

## 2020-09-29 MED ORDER — TRYPAN BLUE 0.06 % OP SOLN
OPHTHALMIC | Status: DC | PRN
  Administered 2020-09-29: 0.5 mL via INTRAOCULAR

## 2020-09-29 MED ORDER — TROPICAMIDE 1 % OP SOLN
1.0000 [drp] | OPHTHALMIC | Status: AC
  Administered 2020-09-29 (×3): 1 [drp] via OPHTHALMIC

## 2020-09-29 MED ORDER — SODIUM HYALURONATE 10 MG/ML IO SOLUTION
PREFILLED_SYRINGE | INTRAOCULAR | Status: DC | PRN
  Administered 2020-09-29: 0.85 mL via INTRAOCULAR

## 2020-09-29 MED ORDER — SODIUM HYALURONATE 23MG/ML IO SOSY
PREFILLED_SYRINGE | INTRAOCULAR | Status: DC | PRN
  Administered 2020-09-29: 0.6 mL via INTRAOCULAR

## 2020-09-29 MED ORDER — PHENYLEPHRINE-KETOROLAC 1-0.3 % IO SOLN
INTRAOCULAR | Status: DC | PRN
  Administered 2020-09-29: 500 mL via OPHTHALMIC

## 2020-09-29 SURGICAL SUPPLY — 11 items
CLOTH BEACON ORANGE TIMEOUT ST (SAFETY) ×2 IMPLANT
EYE SHIELD UNIVERSAL CLEAR (GAUZE/BANDAGES/DRESSINGS) ×2 IMPLANT
GLOVE SURG UNDER POLY LF SZ6.5 (GLOVE) ×2 IMPLANT
GLOVE SURG UNDER POLY LF SZ7 (GLOVE) ×2 IMPLANT
NEEDLE HYPO 18GX1.5 BLUNT FILL (NEEDLE) ×2 IMPLANT
PAD ARMBOARD 7.5X6 YLW CONV (MISCELLANEOUS) ×2 IMPLANT
SYR TB 1ML LL NO SAFETY (SYRINGE) ×2 IMPLANT
TAPE SURG TRANSPORE 1 IN (GAUZE/BANDAGES/DRESSINGS) ×1 IMPLANT
TAPE SURGICAL TRANSPORE 1 IN (GAUZE/BANDAGES/DRESSINGS) ×1
TECHNIS 1-PIECE IOL (Intraocular Lens) ×2 IMPLANT
WATER STERILE IRR 250ML POUR (IV SOLUTION) ×2 IMPLANT

## 2020-09-29 NOTE — Anesthesia Procedure Notes (Signed)
Procedure Name: MAC Date/Time: 09/29/2020 11:37 AM Performed by: Orlie Dakin, CRNA Pre-anesthesia Checklist: Patient identified, Emergency Drugs available, Suction available and Patient being monitored Patient Re-evaluated:Patient Re-evaluated prior to induction Oxygen Delivery Method: Nasal cannula Placement Confirmation: positive ETCO2

## 2020-09-29 NOTE — Op Note (Signed)
Date of procedure: 09/29/20  Pre-operative diagnosis: Visually significant age-related combined cataract, Left Eye (H25.812)  Post-operative diagnosis: Visually significant age-related combined cataract, Left Eye (H25.812)  Procedure: Removal of cataract via phacoemulsification and insertion of intra-ocular lens [employer]  +21.0D into the capsular bag of the Left Eye  Attending surgeon: Gerda Diss. Virgil Lightner, MD, MA  Anesthesia: MAC, Topical Akten  Complications: None  Estimated Blood Loss: <28m (minimal)  Specimens: None  Implants: As above  Indications:  Visually significant age-related cataract, Left Eye  Procedure:  The patient was seen and identified in the pre-operative area. The operative eye was identified and dilated.  The operative eye was marked.  Topical anesthesia was administered to the operative eye.     The patient was then to the operative suite and placed in the supine position.  A timeout was performed confirming the patient, procedure to be performed, and all other relevant information.   The patient's face was prepped and draped in the usual fashion for intra-ocular surgery.  A lid speculum was placed into the operative eye and the surgical microscope moved into place and focused.  An inferotemporal paracentesis was created using a 20 gauge paracentesis blade. Vision Blue was used to stain the anterior capsule. Shugarcaine was injected into the anterior chamber.  Viscoelastic was injected into the anterior chamber.  A temporal clear-corneal main wound incision was created using a 2.440mmicrokeratome.  A continuous curvilinear capsulorrhexis was initiated using an irrigating cystitome and completed using capsulorrhexis forceps.  Hydrodissection and hydrodeliniation were performed.  Viscoelastic was injected into the anterior chamber.  A phacoemulsification handpiece and a chopper as a second instrument were used to remove the nucleus and epinucleus. The  irrigation/aspiration handpiece was used to remove any remaining cortical material.   The capsular bag was reinflated with viscoelastic, checked, and found to be intact.  The intraocular lens was inserted into the capsular bag.  The irrigation/aspiration handpiece was used to remove any remaining viscoelastic.  The clear corneal wound and paracentesis wounds were then hydrated and checked with Weck-Cels to be watertight.  The lid-speculum was removed.  The drape was removed.  The patient's face was cleaned with a wet and dry 4x4.   Maxitrol was instilled in the eye. A clear shield was taped over the eye. The patient was taken to the post-operative care unit in good condition, having tolerated the procedure well.  Post-Op Instructions: [facility] and will receive all other orders and instructions.

## 2020-09-29 NOTE — Interval H&P Note (Signed)
History and Physical Interval Note:  09/29/2020 11:29 AM  Elberta Spaniel  has presented today for surgery, with the diagnosis of Nuclear sclerotic cataract - Left eye.  The various methods of treatment have been discussed with the patient and family. After consideration of risks, benefits and other options for treatment, the patient has consented to  Procedure(s) with comments: CATARACT EXTRACTION PHACO AND INTRAOCULAR LENS PLACEMENT ([place]) (Left) - left as a surgical intervention.  The patient's history has been reviewed, patient examined, no change in status, stable for surgery.  I have reviewed the patient's chart and labs.  Questions were answered to the patient's satisfaction.     Baruch Goldmann

## 2020-09-29 NOTE — Discharge Instructions (Signed)
Please discharge patient when stable, will follow up today with Dr. Shanique Aslinger at the [facility] [place] office immediately following discharge.  Leave shield in place until visit.  All paperwork with discharge instructions will be given at the office.  [facility] Address:  [address]  [place], [place] [address]  

## 2020-09-29 NOTE — Anesthesia Postprocedure Evaluation (Signed)
Anesthesia Post Note  Patient: Lacey Snyder  Procedure(s) Performed: CATARACT EXTRACTION PHACO AND INTRAOCULAR LENS PLACEMENT ([facility]) (Left: Eye)  Patient location during evaluation: Phase II Anesthesia Type: MAC Level of consciousness: awake and alert and oriented Pain management: pain level controlled Vital Signs Assessment: post-procedure vital signs reviewed and stable Respiratory status: spontaneous breathing and respiratory function stable Cardiovascular status: blood pressure returned to baseline and stable Postop Assessment: no apparent nausea or vomiting Anesthetic complications: no   No notable events documented.   Last Vitals:  Vitals:   09/29/20 1100 09/29/20 1154  BP: 138/68 (!) 158/60  Pulse: 61 (!) 55  Resp: 14 16  Temp:  (!) 36.4 C  SpO2: 97% 98%    Last Pain:  Vitals:   09/29/20 1154  TempSrc: Oral  PainSc: 0-No pain                 Mysti Haley C Lakeeta Dobosz

## 2020-09-29 NOTE — Transfer of Care (Signed)
Immediate Anesthesia Transfer of Care Note  Patient: Lacey Snyder  Procedure(s) Performed: CATARACT EXTRACTION PHACO AND INTRAOCULAR LENS PLACEMENT ([facility]) (Left: Eye)  Patient Location: Short Stay  Anesthesia Type:MAC  Level of Consciousness: awake, alert  and oriented  Airway & Oxygen Therapy: Patient Spontanous Breathing  Post-op Assessment: Report given to RN and Post -op Vital signs reviewed and stable  Post vital signs: Reviewed and stable  Last Vitals:  Vitals Value Taken Time  BP    Temp    Pulse    Resp    SpO2      Last Pain:  Vitals:   09/29/20 1041  PainSc: 0-No pain         Complications: No notable events documented.

## 2020-09-29 NOTE — Anesthesia Preprocedure Evaluation (Signed)
Anesthesia Evaluation  Patient identified by MRN, date of birth, ID band Patient awake    Reviewed: Allergy & Precautions, NPO status , Patient's Chart, lab work & pertinent test results  Airway        Dental  (+) Dental Advisory Given   Pulmonary neg pulmonary ROS,    Pulmonary exam normal breath sounds clear to auscultation       Cardiovascular hypertension, Pt. on medications Normal cardiovascular exam Rhythm:Regular Rate:Normal     Neuro/Psych negative neurological ROS  negative psych ROS   GI/Hepatic negative GI ROS, Neg liver ROS,   Endo/Other  diabetes, Well Controlled, Type 2, Oral Hypoglycemic Agents  Renal/GU negative Renal ROS     Musculoskeletal negative musculoskeletal ROS (+)   Abdominal   Peds  Hematology negative hematology ROS (+)   Anesthesia Other Findings   Reproductive/Obstetrics negative OB ROS                             Anesthesia Physical Anesthesia Plan  ASA: 2  Anesthesia Plan: MAC   Post-op Pain Management:    Induction:   PONV Risk Score and Plan:   Airway Management Planned: Nasal Cannula and Natural Airway  Additional Equipment:   Intra-op Plan:   Post-operative Plan:   Informed Consent: I have reviewed the patients History and Physical, chart, labs and discussed the procedure including the risks, benefits and alternatives for the proposed anesthesia with the patient or authorized representative who has indicated his/her understanding and acceptance.     Dental advisory given  Plan Discussed with: CRNA and Surgeon  Anesthesia Plan Comments:         Anesthesia Quick Evaluation

## 2020-10-02 ENCOUNTER — Encounter (HOSPITAL_COMMUNITY): Payer: Self-pay | Admitting: Ophthalmology

## 2020-10-09 DIAGNOSIS — H2512 Age-related nuclear cataract, left eye: Secondary | ICD-10-CM | POA: Diagnosis not present

## 2020-12-11 DIAGNOSIS — H43393 Other vitreous opacities, bilateral: Secondary | ICD-10-CM | POA: Diagnosis not present

## 2020-12-11 DIAGNOSIS — E11319 Type 2 diabetes mellitus with unspecified diabetic retinopathy without macular edema: Secondary | ICD-10-CM | POA: Diagnosis not present

## 2020-12-27 DIAGNOSIS — R5383 Other fatigue: Secondary | ICD-10-CM | POA: Diagnosis not present

## 2020-12-27 DIAGNOSIS — R350 Frequency of micturition: Secondary | ICD-10-CM | POA: Diagnosis not present

## 2020-12-27 DIAGNOSIS — Z299 Encounter for prophylactic measures, unspecified: Secondary | ICD-10-CM | POA: Diagnosis not present

## 2020-12-27 DIAGNOSIS — E1165 Type 2 diabetes mellitus with hyperglycemia: Secondary | ICD-10-CM | POA: Diagnosis not present

## 2020-12-27 DIAGNOSIS — I10 Essential (primary) hypertension: Secondary | ICD-10-CM | POA: Diagnosis not present

## 2021-01-01 DIAGNOSIS — J069 Acute upper respiratory infection, unspecified: Secondary | ICD-10-CM | POA: Diagnosis not present

## 2021-01-01 DIAGNOSIS — Z299 Encounter for prophylactic measures, unspecified: Secondary | ICD-10-CM | POA: Diagnosis not present

## 2021-01-01 DIAGNOSIS — J320 Chronic maxillary sinusitis: Secondary | ICD-10-CM | POA: Diagnosis not present

## 2021-01-01 DIAGNOSIS — Z789 Other specified health status: Secondary | ICD-10-CM | POA: Diagnosis not present

## 2021-01-01 DIAGNOSIS — I10 Essential (primary) hypertension: Secondary | ICD-10-CM | POA: Diagnosis not present

## 2021-01-01 DIAGNOSIS — R350 Frequency of micturition: Secondary | ICD-10-CM | POA: Diagnosis not present

## 2021-01-25 DIAGNOSIS — I10 Essential (primary) hypertension: Secondary | ICD-10-CM | POA: Diagnosis not present

## 2021-01-25 DIAGNOSIS — Z299 Encounter for prophylactic measures, unspecified: Secondary | ICD-10-CM | POA: Diagnosis not present

## 2021-01-25 DIAGNOSIS — K219 Gastro-esophageal reflux disease without esophagitis: Secondary | ICD-10-CM | POA: Diagnosis not present

## 2021-01-25 DIAGNOSIS — K449 Diaphragmatic hernia without obstruction or gangrene: Secondary | ICD-10-CM | POA: Diagnosis not present

## 2021-02-13 DIAGNOSIS — R5383 Other fatigue: Secondary | ICD-10-CM | POA: Diagnosis not present

## 2021-02-13 DIAGNOSIS — Z789 Other specified health status: Secondary | ICD-10-CM | POA: Diagnosis not present

## 2021-02-13 DIAGNOSIS — Z0000 Encounter for general adult medical examination without abnormal findings: Secondary | ICD-10-CM | POA: Diagnosis not present

## 2021-02-13 DIAGNOSIS — Z79899 Other long term (current) drug therapy: Secondary | ICD-10-CM | POA: Diagnosis not present

## 2021-02-13 DIAGNOSIS — Z1331 Encounter for screening for depression: Secondary | ICD-10-CM | POA: Diagnosis not present

## 2021-02-13 DIAGNOSIS — I10 Essential (primary) hypertension: Secondary | ICD-10-CM | POA: Diagnosis not present

## 2021-02-13 DIAGNOSIS — Z7189 Other specified counseling: Secondary | ICD-10-CM | POA: Diagnosis not present

## 2021-02-13 DIAGNOSIS — Z1339 Encounter for screening examination for other mental health and behavioral disorders: Secondary | ICD-10-CM | POA: Diagnosis not present

## 2021-02-13 DIAGNOSIS — Z6825 Body mass index (BMI) 25.0-25.9, adult: Secondary | ICD-10-CM | POA: Diagnosis not present

## 2021-02-13 DIAGNOSIS — Z299 Encounter for prophylactic measures, unspecified: Secondary | ICD-10-CM | POA: Diagnosis not present

## 2021-03-01 DIAGNOSIS — K219 Gastro-esophageal reflux disease without esophagitis: Secondary | ICD-10-CM | POA: Insufficient documentation

## 2021-03-06 DIAGNOSIS — Z79899 Other long term (current) drug therapy: Secondary | ICD-10-CM | POA: Diagnosis not present

## 2021-03-06 DIAGNOSIS — K922 Gastrointestinal hemorrhage, unspecified: Secondary | ICD-10-CM | POA: Diagnosis not present

## 2021-03-06 DIAGNOSIS — I10 Essential (primary) hypertension: Secondary | ICD-10-CM | POA: Diagnosis not present

## 2021-03-06 DIAGNOSIS — K2210 Ulcer of esophagus without bleeding: Secondary | ICD-10-CM | POA: Diagnosis not present

## 2021-03-06 DIAGNOSIS — E119 Type 2 diabetes mellitus without complications: Secondary | ICD-10-CM | POA: Diagnosis not present

## 2021-03-06 DIAGNOSIS — Z7984 Long term (current) use of oral hypoglycemic drugs: Secondary | ICD-10-CM | POA: Diagnosis not present

## 2021-03-06 DIAGNOSIS — R12 Heartburn: Secondary | ICD-10-CM | POA: Diagnosis not present

## 2021-03-06 DIAGNOSIS — Q399 Congenital malformation of esophagus, unspecified: Secondary | ICD-10-CM | POA: Diagnosis not present

## 2021-03-06 DIAGNOSIS — K219 Gastro-esophageal reflux disease without esophagitis: Secondary | ICD-10-CM | POA: Diagnosis not present

## 2021-03-13 DIAGNOSIS — E1165 Type 2 diabetes mellitus with hyperglycemia: Secondary | ICD-10-CM | POA: Diagnosis not present

## 2021-03-13 DIAGNOSIS — K219 Gastro-esophageal reflux disease without esophagitis: Secondary | ICD-10-CM | POA: Diagnosis not present

## 2021-03-13 DIAGNOSIS — Z299 Encounter for prophylactic measures, unspecified: Secondary | ICD-10-CM | POA: Diagnosis not present

## 2021-03-13 DIAGNOSIS — I10 Essential (primary) hypertension: Secondary | ICD-10-CM | POA: Diagnosis not present

## 2021-04-04 ENCOUNTER — Encounter: Payer: Self-pay | Admitting: Physician Assistant

## 2021-04-04 DIAGNOSIS — E1165 Type 2 diabetes mellitus with hyperglycemia: Secondary | ICD-10-CM | POA: Diagnosis not present

## 2021-04-04 DIAGNOSIS — Z789 Other specified health status: Secondary | ICD-10-CM | POA: Diagnosis not present

## 2021-04-04 DIAGNOSIS — Z299 Encounter for prophylactic measures, unspecified: Secondary | ICD-10-CM | POA: Diagnosis not present

## 2021-04-04 DIAGNOSIS — I10 Essential (primary) hypertension: Secondary | ICD-10-CM | POA: Diagnosis not present

## 2021-04-12 DIAGNOSIS — C44319 Basal cell carcinoma of skin of other parts of face: Secondary | ICD-10-CM | POA: Diagnosis not present

## 2021-04-12 DIAGNOSIS — L570 Actinic keratosis: Secondary | ICD-10-CM | POA: Diagnosis not present

## 2021-04-12 DIAGNOSIS — X32XXXA Exposure to sunlight, initial encounter: Secondary | ICD-10-CM | POA: Diagnosis not present

## 2021-04-17 ENCOUNTER — Other Ambulatory Visit: Payer: Self-pay

## 2021-04-18 ENCOUNTER — Ambulatory Visit: Payer: Medicare HMO | Admitting: Physician Assistant

## 2021-04-18 NOTE — Progress Notes (Signed)
04/19/2021 Lacey Snyder [id number] 02/11/1941   ASSESSMENT AND PLAN:   Gastroesophageal reflux disease with esophagitis without hemorrhage -     US ABDOMEN LIMITED RUQ (LIVER/GB); Future 03/06/2021 endoscopy reviewed through care everywhere at [facility] for reflux.  Showed distal esophagus mild torturous, localized moderate mucosal changes proximal to GE J.  Scant dark blood in gastric fundus. Unable to complete due to looping Continue PPI 4-6 weeks Will schedule EGD to evaluate esophagitis, if unable to evaluate small bowel could get CT versus UGI with small bowel follow through.  Will get RUQ [place] with family history and worse with fatty foods Lifestyle discussed.  Will schedule EGD to evaluate for possible H. pylori, eosinophilic esophagitis, peptic ulcer disease, or strictures. I discussed risks of EGD with patient today, including risk of sedation, bleeding or perforation.  Patient provides understanding and gave verbal consent to proceed.   Patient Care Team: Berenice Primas as PCP - General (Nurse Practitioner)  HISTORY OF PRESENT ILLNESS: 81 y.o. female referred by Berenice Primas, NP, with a past medical history of hypertension, osteoporosis and others listed below presents for evaluation of GERD.   03/06/2021 endoscopy reviewed through care everywhere at [facility] for reflux.  Showed distal esophagus mild torturous, localized moderate mucosal changes proximal to GE J.  Scant dark blood in gastric fundus.  Scope could not be advanced beyond the fundus due to looping and resistance encountered.  Procedure was terminated  She has daughter, April is with her.  She states she was having acid reflux, worse at night, would have it stop in her mid esophagus and would have to vomit. She was given a medication by PCP that helped some, went to [facility] surgical specialist and has EGD that showed looping and could not complete.  Lives in [place], wanted GI that was closer.   She has been on  pantoprazole 40 mg once a day,  She was on metformin x 1 year for her sugars, has stopped herself about 2 weeks ago due to possible side effects.  GERD is still there, not as severe, worse with greasy foods.  Dysphagia has improved, no issues in the last 2 weeks.  Denies nausea/vomiting.  Patient denies alcohol or tobacco use.  NSAIDs rarely. She has lost only 2-3 lbs.  Sister with GB out, no GI family malignancy.  Had labs an PCP 11/2020, states they were normal.  Current Medications:    Current Outpatient Medications (Cardiovascular):    lisinopril-hydrochlorothiazide (PRINZIDE,ZESTORETIC) 20-12.5 MG tablet, Take 1 tablet by mouth daily.   Current Outpatient Medications (Analgesics):    HYDROcodone-acetaminophen (NORCO/VICODIN) 5-325 MG tablet, Take 1 tablet by mouth every 4 (four) hours as needed.   Current Outpatient Medications (Other):    CALCIUM IN BONE MINERAL CMPLX PO, Take 3 tablets by mouth daily.   Cholecalciferol 125 MCG (5000 UT) capsule, Take 5,000 Units by mouth daily.   CINNAMON PO, Take 1 capsule by mouth daily.   Coenzyme Q10 (CO Q-10) 100 MG CAPS, Take 100 mg by mouth daily.   ketotifen (ZADITOR) 0.025 % ophthalmic solution, Place 1 drop into both eyes as needed.   Krill Oil 500 MG CAPS, Take 500 mg by mouth daily.   Lutein 20 MG TABS, Take 20 mg by mouth daily.   Menthol-Methyl Salicylate (MUSCLE RUB) 10-15 % CREA, Apply 1 application topically as needed for muscle pain.   ondansetron (ZOFRAN) 4 MG tablet, Take 1 tablet (4 mg total) by mouth every 6 (six)  hours.   pantoprazole (PROTONIX) 40 MG tablet, 40 mg daily.   TURMERIC PO, Take 1 capsule by mouth daily.  Medical History:  Past Medical History:  Diagnosis Date   Cataract    Diabetes mellitus without complication ([place])    Hypertension    Osteoporosis    Scoliosis    Allergies: No Known Allergies   Surgical History:  She  has a past surgical history that includes Tubal ligation; Arm Surgery (Left);  Cataract extraction w/PHACO (Right, 09/08/2020); Cataract extraction w/PHACO (Left, 09/29/2020); and Back surgery. Family History:  Her family history includes Anuerysm in her sister; Diabetes in her brother and father; Diverticulitis in her daughter; Heart disease in her mother and son; Hypertension in her father, mother, and sister; Lung cancer in her brother. Social History:   reports that she has never smoked. She has never used smokeless tobacco. She reports that she does not drink alcohol and does not use drugs.  REVIEW OF SYSTEMS  : All other systems reviewed and negative except where noted in the History of Present Illness.   PHYSICAL EXAM: BP 126/68 (BP Location: Left Arm, Patient Position: Sitting, Cuff Size: Normal)    Pulse 60    Ht 5' 0.5" (1.537 m) Comment: height measured without shoes   Wt 129 lb 2 oz (58.6 kg)    BMI 24.80 kg/m  General:   Pleasant, well developed female in no acute distress Head:  Normocephalic and atraumatic. Eyes: sclerae anicteric,conjunctive pink  Heart:  regular rate and rhythm Pulm: Clear anteriorly; no wheezing Abdomen:  Soft,  nondistended  AB, skin exam normal, Normal bowel sounds.  no  tenderness . Without guarding and Without rebound, without hepatomegaly. Extremities:  Without edema. Msk:  Symmetrical without gross deformities. Peripheral pulses intact.  Neurologic:  Alert and  oriented x4;  grossly normal neurologically. Skin:   Dry and intact without significant lesions or rashes. Psychiatric: Demonstrates good judgement and reason without abnormal affect or behaviors.   Vladimir Crofts, PA-C 8:58 AM

## 2021-04-19 ENCOUNTER — Encounter: Payer: Self-pay | Admitting: Physician Assistant

## 2021-04-19 ENCOUNTER — Ambulatory Visit: Payer: Medicare HMO | Admitting: Physician Assistant

## 2021-04-19 VITALS — BP 126/68 | HR 60 | Ht 60.5 in | Wt 129.1 lb

## 2021-04-19 DIAGNOSIS — K2100 Gastro-esophageal reflux disease with esophagitis, without bleeding: Secondary | ICD-10-CM | POA: Diagnosis not present

## 2021-04-19 NOTE — Patient Instructions (Addendum)
IMAGING: You will be contacted by [facility] (Your caller ID will indicate phone # [phone]) in the next 7 days to schedule your Ultrasound. If you have not heard from them within 7 business days, please call [facility] at [phone] to follow up on the status of your appointment.   PROCEDURES: You have been scheduled for a EGD. Please follow the written instructions given to you at your visit today. If you use inhalers (even only as needed), please bring them with you on the day of your procedure.  Please take your proton pump inhibitor medication 30 minutes to 1 hour before meals- this makes it more effective- TAKE IT DAILY FOR AT LEAST 4-6 WEEKS Avoid spicy and acidic foods Avoid fatty foods Limit your intake of coffee, tea, alcohol, and carbonated drinks Work to maintain a healthy weight Keep the head of the bed elevated at least 3 inches with blocks or a wedge pillow if you are having any nighttime symptoms Stay upright for 2 hours after eating Avoid meals and snacks three to four hours before bedtime   Gastroesophageal Reflux Disease, Adult Gastroesophageal reflux (GER) happens when acid from the stomach flows up into the tube that connects the mouth and the stomach (esophagus). Normally, food travels down the esophagus and stays in the stomach to be digested. However, when a person has GER, food and stomach acid sometimes move back up into the esophagus. If this becomes a more serious problem, the person may be diagnosed with a disease called gastroesophageal reflux disease (GERD). GERD occurs when the reflux: Happens often. Causes frequent or severe symptoms. Causes problems such as damage to the esophagus. When stomach acid comes in contact with the esophagus, the acid may cause inflammation in the esophagus. Over time, GERD may create small holes (ulcers) in the lining of the esophagus. What are the causes? This condition is caused by a  problem with the muscle between the esophagus and the stomach (lower esophageal sphincter, or LES). Normally, the LES muscle closes after food passes through the esophagus to the stomach. When the LES is weakened or abnormal, it does not close properly, and that allows food and stomach acid to go back up into the esophagus. The LES can be weakened by certain dietary substances, medicines, and medical conditions, including: Tobacco use. Pregnancy. Having a hiatal hernia. Alcohol use. Certain foods and beverages, such as coffee, chocolate, onions, and peppermint. What increases the risk? You are more likely to develop this condition if you: Have an increased body weight. Have a connective tissue disorder. Take NSAIDs, such as ibuprofen. What are the signs or symptoms? Symptoms of this condition include: Heartburn. Difficult or painful swallowing and the feeling of having a lump in the throat. A bitter taste in the mouth. Bad breath and having a large amount of saliva. Having an upset or bloated stomach and belching. Chest pain. Different conditions can cause chest pain. Make sure you see your health care provider if you experience chest pain. Shortness of breath or wheezing. Ongoing (chronic) cough or a nighttime cough. Wearing away of tooth enamel. Weight loss. How is this diagnosed? This condition may be diagnosed based on a medical history and a physical exam. To determine if you have mild or severe GERD, your health care provider may also monitor how you respond to treatment. You may also have tests, including: A test to examine your stomach and esophagus with a small camera (endoscopy). A test that measures  the acidity level in your esophagus. A test that measures how much pressure is on your esophagus. A barium swallow or modified barium swallow test to show the shape, size, and functioning of your esophagus. How is this treated? Treatment for this condition may vary depending on  how severe your symptoms are. Your health care provider may recommend: Changes to your diet. Medicine. Surgery. The goal of treatment is to help relieve your symptoms and to prevent complications. Follow these instructions at home: Eating and drinking  Follow a diet as recommended by your health care provider. This may involve avoiding foods and drinks such as: Coffee and tea, with or without caffeine. Drinks that contain alcohol. Energy drinks and sports drinks. Carbonated drinks or sodas. Chocolate and cocoa. Peppermint and mint flavorings. Garlic and onions. Horseradish. Spicy and acidic foods, including peppers, chili powder, curry powder, vinegar, hot sauces, and barbecue sauce. Citrus fruit juices and citrus fruits, such as oranges, lemons, and limes. Tomato-based foods, such as red sauce, chili, salsa, and pizza with red sauce. Fried and fatty foods, such as donuts, french fries, potato chips, and high-fat dressings. High-fat meats, such as hot dogs and fatty cuts of red and white meats, such as rib eye steak, sausage, ham, and bacon. High-fat dairy items, such as whole milk, butter, and cream cheese. Eat small, frequent meals instead of large meals. Avoid drinking large amounts of liquid with your meals. Avoid eating meals during the 2-3 hours before bedtime. Avoid lying down right after you eat. Do not exercise right after you eat. Lifestyle  Do not use any products that contain nicotine or tobacco. These products include cigarettes, chewing tobacco, and vaping devices, such as e-cigarettes. If you need help quitting, ask your health care provider. Try to reduce your stress by using methods such as yoga or meditation. If you need help reducing stress, ask your health care provider. If you are overweight, reduce your weight to an amount that is healthy for you. Ask your health care provider for guidance about a safe weight loss goal. General instructions Pay attention to any  changes in your symptoms. Take over-the-counter and prescription medicines only as told by your health care provider. Do not take aspirin, ibuprofen, or other NSAIDs unless your health care provider told you to take these medicines. Wear loose-fitting clothing. Do not wear anything tight around your waist that causes pressure on your abdomen. Raise (elevate) the head of your bed about 6 inches (15 cm). You can use a wedge to do this. Avoid bending over if this makes your symptoms worse. Keep all follow-up visits. This is important. Contact a health care provider if: You have: New symptoms. Unexplained weight loss. Difficulty swallowing or it hurts to swallow. Wheezing or a persistent cough. A hoarse voice. Your symptoms do not improve with treatment. Get help right away if: You have sudden pain in your arms, neck, jaw, teeth, or back. You suddenly feel sweaty, dizzy, or light-headed. You have chest pain or shortness of breath. You vomit and the vomit is green, yellow, or black, or it looks like blood or coffee grounds. You faint. You have stool that is red, bloody, or black. You cannot swallow, drink, or eat. These symptoms may represent a serious problem that is an emergency. Do not wait to see if the symptoms will go away. Get medical help right away. Call your local emergency services (911 in the [place] Do not drive yourself to the hospital. Summary Gastroesophageal reflux happens when acid  from the stomach flows up into the esophagus. GERD is a disease in which the reflux happens often, causes frequent or severe symptoms, or causes problems such as damage to the esophagus. Treatment for this condition may vary depending on how severe your symptoms are. Your health care provider may recommend diet and lifestyle changes, medicine, or surgery. Contact a health care provider if you have new or worsening symptoms. Take over-the-counter and prescription medicines only as told by your health  care provider. Do not take aspirin, ibuprofen, or other NSAIDs unless your health care provider told you to do so. Keep all follow-up visits as told by your health care provider. This is important. This information is not intended to replace advice given to you by your health care provider. Make sure you discuss any questions you have with your health care provider. Document Revised: 08/23/2019 Document Reviewed: 08/23/2019 Elsevier Patient Education  [place].

## 2021-04-30 ENCOUNTER — Ambulatory Visit (HOSPITAL_COMMUNITY)
Admission: RE | Admit: 2021-04-30 | Discharge: 2021-04-30 | Disposition: A | Discharge status: Home / Self Care | Payer: Medicare HMO | Source: Ambulatory Visit | Attending: Physician Assistant | Admitting: Physician Assistant

## 2021-04-30 ENCOUNTER — Other Ambulatory Visit: Payer: Self-pay

## 2021-04-30 DIAGNOSIS — R100 Acute abdomen: Secondary | ICD-10-CM | POA: Diagnosis not present

## 2021-04-30 DIAGNOSIS — K2100 Gastro-esophageal reflux disease with esophagitis, without bleeding: Secondary | ICD-10-CM | POA: Diagnosis not present

## 2021-05-21 DIAGNOSIS — C44319 Basal cell carcinoma of skin of other parts of face: Secondary | ICD-10-CM | POA: Diagnosis not present

## 2021-05-30 ENCOUNTER — Ambulatory Visit (AMBULATORY_SURGERY_CENTER): Payer: Medicare HMO | Admitting: Gastroenterology

## 2021-05-30 ENCOUNTER — Encounter: Payer: Self-pay | Admitting: Gastroenterology

## 2021-05-30 VITALS — BP 148/63 | HR 57 | Temp 97.5°F | Resp 11 | Ht 60.0 in | Wt 129.0 lb

## 2021-05-30 DIAGNOSIS — K2100 Gastro-esophageal reflux disease with esophagitis, without bleeding: Secondary | ICD-10-CM

## 2021-05-30 DIAGNOSIS — R1310 Dysphagia, unspecified: Secondary | ICD-10-CM | POA: Diagnosis not present

## 2021-05-30 DIAGNOSIS — K449 Diaphragmatic hernia without obstruction or gangrene: Secondary | ICD-10-CM

## 2021-05-30 DIAGNOSIS — I10 Essential (primary) hypertension: Secondary | ICD-10-CM | POA: Diagnosis not present

## 2021-05-30 DIAGNOSIS — E119 Type 2 diabetes mellitus without complications: Secondary | ICD-10-CM | POA: Diagnosis not present

## 2021-05-30 MED ORDER — SODIUM CHLORIDE 0.9 % IV SOLN: 500.0000 mL | Freq: Once | INTRAVENOUS | Status: DC

## 2021-05-30 NOTE — Progress Notes (Signed)
VS by CW  Pt's states no medical or surgical changes since previsit or office visit.  

## 2021-05-30 NOTE — Progress Notes (Signed)
To pacu VSS. Report to [email or web address] 

## 2021-05-30 NOTE — Progress Notes (Signed)
[facility] History and Physical ? ? ?Primary Care Physician:  Berenice Primas ? ? ?Reason for Procedure:  Dysphagia, regurgitation, GERD with erosive esophagitis, persistent GERD symptoms despite therapy ? ?Plan:    EGD  with possible interventions as needed ? ? ? ? ?HPI: Lacey Snyder is a very pleasant 81 y.o. female here for EGD for evaluation of Dysphagia, regurgitation,GERD with erosive esophagitis, persistent GERD symptoms . ?Please refer to office visit note by [facility] 04/19/21 for additional details ? ?The risks and benefits as well as alternatives of endoscopic procedure(s) have been discussed and reviewed. All questions answered. The patient agrees to proceed. ? ? ? ?Past Medical History:  ?Diagnosis Date  ? Cataract   ? Diabetes mellitus without complication ([place])   ? Hypertension   ? Osteoporosis   ? Scoliosis   ? ? ?Past Surgical History:  ?Procedure Laterality Date  ? Arm Surgery Left   ? fracture  ? BACK SURGERY    ? bone brock off and was touching a nerve  ? CATARACT EXTRACTION W/PHACO Right 09/08/2020  ? Procedure: CATARACT EXTRACTION PHACO AND INTRAOCULAR LENS PLACEMENT RIGHT EYE;  Surgeon: Baruch Goldmann, MD;  Location: [facility];  Service: Ophthalmology;  Laterality: Right;  right ?CDE=9.24  ? CATARACT EXTRACTION W/PHACO Left 09/29/2020  ? Procedure: CATARACT EXTRACTION PHACO AND INTRAOCULAR LENS PLACEMENT ([facility]);  Surgeon: Baruch Goldmann, MD;  Location: [facility];  Service: Ophthalmology;  Laterality: Left;  CDE 7.12  ? TUBAL LIGATION    ? ? ?Prior to Admission medications   ?Medication Sig Start Date End Date Taking? Authorizing Provider  ?CALCIUM IN BONE MINERAL CMPLX PO Take 3 tablets by mouth daily.   Yes [provider]  ?Cholecalciferol 125 MCG (5000 UT) capsule Take 5,000 Units by mouth daily.   Yes [provider]  ?Coenzyme Q10 (CO Q-10) 100 MG CAPS Take 100 mg by mouth daily.   Yes [provider]  ?Astrid Drafts 500 MG CAPS Take 500 mg by mouth  daily.   Yes [provider]  ?lisinopril-hydrochlorothiazide (PRINZIDE,ZESTORETIC) 20-12.5 MG tablet Take 1 tablet by mouth daily. 11/04/16  Yes [provider]  ?Lutein 20 MG TABS Take 20 mg by mouth daily.   Yes [provider]  ?metFORMIN (GLUCOPHAGE) 500 MG tablet Take 500 mg by mouth daily. 05/02/21  Yes [provider]  ?pantoprazole (PROTONIX) 40 MG tablet 40 mg daily. 03/13/21  Yes [provider]  ?TRUE METRIX BLOOD GLUCOSE TEST test strip 2 (two) times daily. 03/17/21  Yes [provider]  ?TURMERIC PO Take 1 capsule by mouth daily.   Yes [provider]  ?CINNAMON PO Take 1 capsule by mouth daily.    [provider]  ?HYDROcodone-acetaminophen (NORCO/VICODIN) 5-325 MG tablet Take 1 tablet by mouth every 4 (four) hours as needed. 02/01/17   Daleen Bo, MD  ?ketotifen (ZADITOR) 0.025 % ophthalmic solution Place 1 drop into both eyes as needed.    [provider]  ?Menthol-Methyl Salicylate (MUSCLE RUB) 10-15 % CREA Apply 1 application topically as needed for muscle pain.    [provider]  ?ondansetron (ZOFRAN) 4 MG tablet Take 1 tablet (4 mg total) by mouth every 6 (six) hours. 02/13/17   Nat Christen, MD  ? ? ?Current Outpatient Medications  ?Medication Sig Dispense Refill  ? CALCIUM IN BONE MINERAL CMPLX PO Take 3 tablets by mouth daily.    ? Cholecalciferol 125 MCG (5000 UT) capsule Take 5,000 Units by mouth daily.    ?  Coenzyme Q10 (CO Q-10) 100 MG CAPS Take 100 mg by mouth daily.    ? Krill Oil 500 MG CAPS Take 500 mg by mouth daily.    ? lisinopril-hydrochlorothiazide (PRINZIDE,ZESTORETIC) 20-12.5 MG tablet Take 1 tablet by mouth daily.    ? Lutein 20 MG TABS Take 20 mg by mouth daily.    ? metFORMIN (GLUCOPHAGE) 500 MG tablet Take 500 mg by mouth daily.    ? pantoprazole (PROTONIX) 40 MG tablet 40 mg daily.    ? TRUE METRIX BLOOD GLUCOSE TEST test strip 2 (two) times daily.    ? TURMERIC PO Take 1 capsule by  mouth daily.    ? CINNAMON PO Take 1 capsule by mouth daily.    ? HYDROcodone-acetaminophen (NORCO/VICODIN) 5-325 MG tablet Take 1 tablet by mouth every 4 (four) hours as needed. 20 tablet 0  ? ketotifen (ZADITOR) 0.025 % ophthalmic solution Place 1 drop into both eyes as needed.    ? Menthol-Methyl Salicylate (MUSCLE RUB) 10-15 % CREA Apply 1 application topically as needed for muscle pain.    ? ondansetron (ZOFRAN) 4 MG tablet Take 1 tablet (4 mg total) by mouth every 6 (six) hours. 12 tablet 0  ? ?Current Facility-Administered Medications  ?Medication Dose Route Frequency Provider Last Rate Last Admin  ? 0.9 %  sodium chloride infusion  500 mL Intravenous Once Jaryah Aracena, Venia Minks, MD      ? ? ?Allergies as of 05/30/2021  ? (No Known Allergies)  ? ? ?Family History  ?Problem Relation Age of Onset  ? Heart disease Mother   ? Hypertension Mother   ? Diabetes Father   ? Hypertension Father   ? Anuerysm Sister   ?     Brain  ? Hypertension Sister   ? Diabetes Brother   ? Lung cancer Brother   ? Diverticulitis Daughter   ? Heart disease Son   ? Colon cancer Neg Hx   ? Esophageal cancer Neg Hx   ? Rectal cancer Neg Hx   ? Stomach cancer Neg Hx   ? ? ?Social History  ? ?Socioeconomic History  ? Marital status: Married  ?  Spouse name: Not on file  ? Number of children: 4  ? Years of education: Not on file  ? Highest education level: Not on file  ?Occupational History  ? Occupation: retired  ?Tobacco Use  ? Smoking status: Never  ? Smokeless tobacco: Never  ?Vaping Use  ? Vaping Use: Never used  ?Substance and Sexual Activity  ? Alcohol use: No  ? Drug use: No  ? Sexual activity: Not on file  ?Other Topics Concern  ? Not on file  ?Social History Narrative  ? Not on file  ? ?Social Determinants of Health  ? ?Financial Resource Strain: Not on file  ?Food Insecurity: Not on file  ?Transportation Needs: Not on file  ?Physical Activity: Not on file  ?Stress: Not on file  ?Social Connections: Not on file  ?Intimate Partner  Violence: Not on file  ? ? ?Review of Systems: ? ?All other review of systems negative except as mentioned in the HPI. ? ?Physical Exam: ?Vital signs in last 24 hours: ?BP (!) 154/68   Pulse 69   Temp (!) 97.5 ?F (36.4 ?C)   Ht 5' (1.524 m)   Wt 129 lb (58.5 kg)   SpO2 97%   BMI 25.19 kg/m?  ?General:   Alert, NAD ?Lungs:  Clear .   ?Heart:  Regular rate and  rhythm ?Abdomen:  Soft, nontender and nondistended. ?Neuro/Psych:  Alert and cooperative. Normal mood and affect. A and O x 3 ? ?Reviewed labs, radiology imaging, old records and pertinent past GI work up ? ?Patient is appropriate for planned procedure(s) and anesthesia in an ambulatory setting ? ? ?K. Denzil Magnuson , MD ?[phone]  ? ? ?  ?

## 2021-05-30 NOTE — Patient Instructions (Signed)
Thank you for coming in to see [place] today! ?Resume previous diet and medications today. ?Return to normal daily activities tomorrow. ?Our office will be in touch to schedule upper GI series, barium study. ?Return to GI clinic after testing is complete, we will call to set this up with you. ? ? ?YOU HAD AN ENDOSCOPIC PROCEDURE TODAY AT [facility]:   Refer to the procedure report that was given to you for any specific questions about what was found during the examination.  If the procedure report does not answer your questions, please call your gastroenterologist to clarify.  If you requested that your care partner not be given the details of your procedure findings, then the procedure report has been included in a sealed envelope for you to review at your convenience later. ? ?YOU SHOULD EXPECT: Some feelings of bloating in the abdomen. Passage of more gas than usual.  Walking can help get rid of the air that was put into your GI tract during the procedure and reduce the bloating. If you had a lower endoscopy (such as a colonoscopy or flexible sigmoidoscopy) you may notice spotting of blood in your stool or on the toilet paper. If you underwent a bowel prep for your procedure, you may not have a normal bowel movement for a few days. ? ?Please Note:  You might notice some irritation and congestion in your nose or some drainage.  This is from the oxygen used during your procedure.  There is no need for concern and it should clear up in a day or so. ? ?SYMPTOMS TO REPORT IMMEDIATELY: ? ?Following upper endoscopy (EGD) ? Vomiting of blood or coffee ground material ? New chest pain or pain under the shoulder blades ? Painful or persistently difficult swallowing ? New shortness of breath ? Fever of 100?F or higher ? Black, tarry-looking stools ? ?For urgent or emergent issues, a gastroenterologist can be reached at any hour by calling [phone]. ?Do not use MyChart messaging for urgent concerns.   ? ? ?DIET:  We do recommend a small meal at first, but then you may proceed to your regular diet.  Drink plenty of fluids but you should avoid alcoholic beverages for 24 hours. ? ?ACTIVITY:  You should plan to take it easy for the rest of today and you should NOT DRIVE or use heavy machinery until tomorrow (because of the sedation medicines used during the test).   ? ?FOLLOW UP: ?Our staff will call the number listed on your records 48-72 hours following your procedure to check on you and address any questions or concerns that you may have regarding the information given to you following your procedure. If we do not reach you, we will leave a message.  We will attempt to reach you two times.  During this call, we will ask if you have developed any symptoms of COVID 19. If you develop any symptoms (ie: fever, flu-like symptoms, shortness of breath, cough etc.) before then, please call [phone].  If you test positive for Covid 19 in the 2 weeks post procedure, please call and report this information to [place].   ? ?If any biopsies were taken you will be contacted by phone or by letter within the next 1-3 weeks.  Please call us at [phone] if you have not heard about the biopsies in 3 weeks.  ? ? ?SIGNATURES/CONFIDENTIALITY: ?You and/or your care partner have signed paperwork which will be entered into your electronic medical record.  These signatures attest to the fact that that the information above on your After Visit Summary has been reviewed and is understood.  Full responsibility of the confidentiality of this discharge information lies with you and/or your care-partner.  ?

## 2021-05-30 NOTE — Op Note (Addendum)
[facility] ?Patient Name: Lacey Snyder ?Procedure Date: 05/30/2021 8:36 AM ?MRN: [id number] ?Endoscopist: Mauri Pole , MD ?Age: 81 ?Referring MD:  ?Date of Birth: 07-05-1940 ?Gender: Female ?Account #: [id number] ?Procedure:                Upper GI endoscopy ?Indications:              Epigastric abdominal pain, Dyspepsia, Dysphagia,  ?                          Heartburn, Esophageal reflux symptoms that persist  ?                          despite appropriate therapy ?Medicines:                Monitored Anesthesia Care ?Procedure:                Pre-Anesthesia Assessment: ?                          - Prior to the procedure, a History and Physical  ?                          was performed, and patient medications and  ?                          allergies were reviewed. The patient's tolerance of  ?                          previous anesthesia was also reviewed. The risks  ?                          and benefits of the procedure and the sedation  ?                          options and risks were discussed with the patient.  ?                          All questions were answered, and informed consent  ?                          was obtained. Prior Anticoagulants: The patient has  ?                          taken no previous anticoagulant or antiplatelet  ?                          agents. ASA Grade Assessment: II - A patient with  ?                          mild systemic disease. After reviewing the risks  ?                          and benefits, the patient was deemed in  ?  satisfactory condition to undergo the procedure. ?                          After obtaining informed consent, the endoscope was  ?                          passed under direct vision. Throughout the  ?                          procedure, the patient's blood pressure, pulse, and  ?                          oxygen saturations were monitored continuously. The  ?                          Endoscope was  introduced through the mouth, and  ?                          advanced to the second part of duodenum. The upper  ?                          GI endoscopy was accomplished without difficulty.  ?                          The patient tolerated the procedure well. ?Scope In: ?Scope Out: ?Findings:                 The gastroesophageal flap valve was visualized  ?                          endoscopically and classified as Hill Grade III  ?                          (minimal fold, loose to endoscope, hiatal hernia  ?                          likely). ?                          The Z-line was regular and was found 35 cm from the  ?                          incisors. ?                          LA Grade B (one or more mucosal breaks greater than  ?                          5 mm, not extending between the tops of two mucosal  ?                          folds) esophagitis with no bleeding was found 35 to  ?  36 cm from the incisors. ?                          A large hiatal hernia was present. ?                          The cardia and gastric fundus were normal on  ?                          retroflexion. ?                          The examined duodenum was normal. ?Complications:            No immediate complications. ?Estimated Blood Loss:     Estimated blood loss was minimal. ?Impression:               - Gastroesophageal flap valve classified as Hill  ?                          Grade III (minimal fold, loose to endoscope, hiatal  ?                          hernia likely). ?                          - Z-line regular, 35 cm from the incisors. ?                          - LA Grade B reflux esophagitis with no bleeding. ?                          - Large hiatal hernia. ?                          - Normal examined duodenum. ?                          - No specimens collected. ?Recommendation:           - Patient has a contact number available for  ?                          emergencies. The signs and  symptoms of potential  ?                          delayed complications were discussed with the  ?                          patient. Return to normal activities tomorrow.  ?                          Written discharge instructions were provided to the  ?                          patient. ?                          -  Resume previous diet. ?                          - Continue present medications. ?                          - Schedule for upper GI series to further evaluate  ?                          the hiatal hernia, possible para esophageal  ?                          component ?                          - Return to GI clinic at the next available  ?                          appointment after barium study ?                          - Continue anti reflux measures. ?Mauri Pole, MD ?05/30/2021 9:14:54 AM ?This report has been signed electronically. ?

## 2021-05-31 ENCOUNTER — Other Ambulatory Visit: Payer: Self-pay

## 2021-05-31 ENCOUNTER — Telehealth: Payer: Self-pay

## 2021-05-31 DIAGNOSIS — R1310 Dysphagia, unspecified: Secondary | ICD-10-CM

## 2021-05-31 DIAGNOSIS — K2100 Gastro-esophageal reflux disease with esophagitis, without bleeding: Secondary | ICD-10-CM

## 2021-05-31 NOTE — Telephone Encounter (Signed)
Order for UGI entered in [facility]. Radiology scheduling notified.  ?Will schedule the follow up appointment as soon as the UGI is scheduled. ?

## 2021-06-04 ENCOUNTER — Telehealth: Payer: Self-pay

## 2021-06-04 NOTE — Telephone Encounter (Signed)
Left message on follow up call. 

## 2021-06-04 NOTE — Telephone Encounter (Signed)
Called [phone] and left a message we tried to reach pt for a follow up call. maw  ?

## 2021-06-06 NOTE — Telephone Encounter (Signed)
Pt is scheduled for UGI series on 4/17. Next available office visit with Dr. Silverio Decamp is not until June. Pt has been scheduled for Wednesday, 08/08/21 at 1:50 pm. Called and spoke with patient, she is aware of appt. I told pt that I will mail her appt info, she confirmed address on file. Pt verbalized understanding and had no concerns at the end of the call. ?

## 2021-06-11 ENCOUNTER — Inpatient Hospital Stay (HOSPITAL_COMMUNITY): Admission: RE | Admit: 2021-06-11 | Payer: Medicare HMO | Source: Ambulatory Visit

## 2021-06-25 DIAGNOSIS — L570 Actinic keratosis: Secondary | ICD-10-CM | POA: Diagnosis not present

## 2021-06-25 DIAGNOSIS — X32XXXD Exposure to sunlight, subsequent encounter: Secondary | ICD-10-CM | POA: Diagnosis not present

## 2021-06-25 DIAGNOSIS — C44319 Basal cell carcinoma of skin of other parts of face: Secondary | ICD-10-CM | POA: Diagnosis not present

## 2021-07-10 DIAGNOSIS — E1165 Type 2 diabetes mellitus with hyperglycemia: Secondary | ICD-10-CM | POA: Diagnosis not present

## 2021-07-10 DIAGNOSIS — I10 Essential (primary) hypertension: Secondary | ICD-10-CM | POA: Diagnosis not present

## 2021-07-10 DIAGNOSIS — Z789 Other specified health status: Secondary | ICD-10-CM | POA: Diagnosis not present

## 2021-07-10 DIAGNOSIS — Z299 Encounter for prophylactic measures, unspecified: Secondary | ICD-10-CM | POA: Diagnosis not present

## 2021-07-13 ENCOUNTER — Ambulatory Visit (HOSPITAL_COMMUNITY)
Admission: RE | Admit: 2021-07-13 | Discharge: 2021-07-13 | Disposition: A | Discharge status: Home / Self Care | Payer: Medicare HMO | Source: Ambulatory Visit | Attending: Gastroenterology | Admitting: Gastroenterology

## 2021-07-13 DIAGNOSIS — K224 Dyskinesia of esophagus: Secondary | ICD-10-CM | POA: Diagnosis not present

## 2021-07-13 DIAGNOSIS — R1310 Dysphagia, unspecified: Secondary | ICD-10-CM | POA: Diagnosis not present

## 2021-07-13 DIAGNOSIS — K449 Diaphragmatic hernia without obstruction or gangrene: Secondary | ICD-10-CM | POA: Diagnosis not present

## 2021-07-13 DIAGNOSIS — K2100 Gastro-esophageal reflux disease with esophagitis, without bleeding: Secondary | ICD-10-CM

## 2021-07-19 ENCOUNTER — Telehealth: Payer: Self-pay | Admitting: Gastroenterology

## 2021-07-19 NOTE — Telephone Encounter (Signed)
Patient returned your call, please advise. 

## 2021-07-19 NOTE — Telephone Encounter (Signed)
Called the patient and discussed her DG esophagus study.

## 2021-08-08 ENCOUNTER — Encounter: Payer: Self-pay | Admitting: Gastroenterology

## 2021-08-08 ENCOUNTER — Ambulatory Visit: Payer: Medicare HMO | Admitting: Gastroenterology

## 2021-08-08 VITALS — BP 154/70 | HR 68 | Ht 62.0 in | Wt 131.8 lb

## 2021-08-08 DIAGNOSIS — K449 Diaphragmatic hernia without obstruction or gangrene: Secondary | ICD-10-CM

## 2021-08-08 DIAGNOSIS — K219 Gastro-esophageal reflux disease without esophagitis: Secondary | ICD-10-CM

## 2021-08-08 NOTE — Progress Notes (Signed)
Lacey Snyder    [id number]    [birth year]  Primary Care Physician:Hairfield, Virl Son  Referring Physician: Berenice Primas [place],  [place] [address]   Chief complaint:  GERD  HPI:  81 year old very pleasant female here for follow-up visit for GERD.  Overall she is doing well.  Denies any dysphagia or vomiting.  She continues to have intermittent epigastric discomfort and postprandial fullness as well as regurgitation.  She is trying to adjust her diet.  She feels daily Protonix is helping with most of the GERD symptoms.  Denies any unintentional weight loss or decreased appetite.   Upper GI series 07/13/21 1. Large sliding-type hiatal hernia with organoaxial positioning. 2. Mild esophageal dysmotility, likely presbyesophagus. 3. Osteopenia with possible T12 and L3 compression deformities.  EGD 05/30/21 - The gastroesophageal flap valve was visualized endoscopically and classified as Hill Grade III (minimal fold, loose to endoscope, hiatal hernia likely). - The Z-line was regular and was found 35 cm from the incisors. - LA Grade B (one or more mucosal breaks greater than 5 mm, not extending between the tops of two mucosal folds) esophagitis with no bleeding was found 35 to 36 cm from the incisors. - A large hiatal hernia was present. - The cardia and gastric fundus were normal on retroflexion.   - The examined duodenum was normal.   Outpatient Encounter Medications as of 08/08/2021  Medication Sig   CALCIUM IN BONE MINERAL CMPLX PO Take 3 tablets by mouth daily.   Cholecalciferol 125 MCG (5000 UT) capsule Take 5,000 Units by mouth daily.   CINNAMON PO Take 1 capsule by mouth daily.   Coenzyme Q10 (CO Q-10) 100 MG CAPS Take 100 mg by mouth daily.   HYDROcodone-acetaminophen (NORCO/VICODIN) 5-325 MG tablet Take 1 tablet by mouth every 4 (four) hours as needed.   ketotifen (ZADITOR) 0.025 % ophthalmic solution Place 1 drop into both eyes as needed.    Krill Oil 500 MG CAPS Take 500 mg by mouth daily.   lisinopril-hydrochlorothiazide (PRINZIDE,ZESTORETIC) 20-12.5 MG tablet Take 1 tablet by mouth daily.   Lutein 20 MG TABS Take 20 mg by mouth daily.   Menthol-Methyl Salicylate (MUSCLE RUB) 10-15 % CREA Apply 1 application topically as needed for muscle pain.   ondansetron (ZOFRAN) 4 MG tablet Take 1 tablet (4 mg total) by mouth every 6 (six) hours.   pantoprazole (PROTONIX) 40 MG tablet 40 mg daily.   TRUE METRIX BLOOD GLUCOSE TEST test strip 2 (two) times daily.   TURMERIC PO Take 1 capsule by mouth daily.   metFORMIN (GLUCOPHAGE) 500 MG tablet Take 500 mg by mouth daily. (Patient not taking: Reported on 08/08/2021)   No facility-administered encounter medications on file as of 08/08/2021.    Allergies as of 08/08/2021   (No Known Allergies)    Past Medical History:  Diagnosis Date   Cataract    Diabetes mellitus without complication ([facility])    Hypertension    Osteoporosis    Scoliosis     Past Surgical History:  Procedure Laterality Date   Arm Surgery Left    fracture   BACK SURGERY     bone brock off and was touching a nerve   CATARACT EXTRACTION W/PHACO Right 09/08/2020   Procedure: CATARACT EXTRACTION PHACO AND INTRAOCULAR LENS PLACEMENT RIGHT EYE;  Surgeon: Baruch Goldmann, MD;  Location: [facility];  Service: Ophthalmology;  Laterality: Right;  right CDE=9.24   CATARACT EXTRACTION  W/PHACO Left 09/29/2020   Procedure: CATARACT EXTRACTION PHACO AND INTRAOCULAR LENS PLACEMENT ([facility]);  Surgeon: Baruch Goldmann, MD;  Location: [facility];  Service: Ophthalmology;  Laterality: Left;  CDE 7.12   TUBAL LIGATION      Family History  Problem Relation Age of Onset   Heart disease Mother    Hypertension Mother    Diabetes Father    Hypertension Father    Anuerysm Sister        Brain   Hypertension Sister    Diabetes Brother    Lung cancer Brother    Diverticulitis Daughter    Heart disease Son    Colon cancer Neg Hx     Esophageal cancer Neg Hx    Rectal cancer Neg Hx    Stomach cancer Neg Hx     Social History   Socioeconomic History   Marital status: Married    Spouse name: Not on file   Number of children: 4   Years of education: Not on file   Highest education level: Not on file  Occupational History   Occupation: retired  Tobacco Use   Smoking status: Never   Smokeless tobacco: Never  Vaping Use   Vaping Use: Never used  Substance and Sexual Activity   Alcohol use: No   Drug use: No   Sexual activity: Not on file  Other Topics Concern   Not on file  Social History Narrative   Not on file   Social Determinants of Health   Financial Resource Strain: Not on file  Food Insecurity: Not on file  Transportation Needs: Not on file  Physical Activity: Not on file  Stress: Not on file  Social Connections: Not on file  Intimate Partner Violence: Not on file      Review of systems: All other review of systems negative except as mentioned in the HPI.   Physical Exam: Vitals:   08/08/21 1403  BP: (!) 154/70  Pulse: 68   Body mass index is 24.11 kg/m. Gen:      No acute distress HEENT:  sclera anicteric Abd:      soft, non-tender; no palpable masses, no distension Ext:    No edema Neuro: alert and oriented x 3 Psych: normal mood and affect  Data Reviewed:  Reviewed labs, radiology imaging, old records and pertinent past GI work up   Assessment and Plan/Recommendations:  81 year old very pleasant female with large sliding hiatal hernia and GERD Overall her symptoms are manageable on a daily PPI and antireflux measures She is reluctant to undergo hiatal hernia repair or antireflux procedure Continue to monitor  Small frequent meals Continue daily pantoprazole 40 mg, 30 minutes before breakfast  Return in 3 to 4 months or sooner if needed    This visit required >30 minutes of patient care (this includes precharting, chart review, review of results, face-to-face time  used for counseling as well as treatment plan and follow-up. The patient was provided an opportunity to ask questions and all were answered. The patient agreed with the plan and demonstrated an understanding of the instructions.  Lacey Snyder , MD    CC: Berenice Primas, NP

## 2021-08-08 NOTE — Patient Instructions (Signed)
Eat small frequent meals with high protein   Gastroesophageal Reflux Disease, Adult Gastroesophageal reflux (GER) happens when acid from the stomach flows up into the tube that connects the mouth and the stomach (esophagus). Normally, food travels down the esophagus and stays in the stomach to be digested. However, when a person has GER, food and stomach acid sometimes move back up into the esophagus. If this becomes a more serious problem, the person may be diagnosed with a disease called gastroesophageal reflux disease (GERD). GERD occurs when the reflux: Happens often. Causes frequent or severe symptoms. Causes problems such as damage to the esophagus. When stomach acid comes in contact with the esophagus, the acid may cause inflammation in the esophagus. Over time, GERD may create small holes (ulcers) in the lining of the esophagus. What are the causes? This condition is caused by a problem with the muscle between the esophagus and the stomach (lower esophageal sphincter, or LES). Normally, the LES muscle closes after food passes through the esophagus to the stomach. When the LES is weakened or abnormal, it does not close properly, and that allows food and stomach acid to go back up into the esophagus. The LES can be weakened by certain dietary substances, medicines, and medical conditions, including: Tobacco use. Pregnancy. Having a hiatal hernia. Alcohol use. Certain foods and beverages, such as coffee, chocolate, onions, and peppermint. What increases the risk? You are more likely to develop this condition if you: Have an increased body weight. Have a connective tissue disorder. Take NSAIDs, such as ibuprofen. What are the signs or symptoms? Symptoms of this condition include: Heartburn. Difficult or painful swallowing and the feeling of having a lump in the throat. A bitter taste in the mouth. Bad breath and having a large amount of saliva. Having an upset or bloated stomach and  belching. Chest pain. Different conditions can cause chest pain. Make sure you see your health care provider if you experience chest pain. Shortness of breath or wheezing. Ongoing (chronic) cough or a nighttime cough. Wearing away of tooth enamel. Weight loss. How is this diagnosed? This condition may be diagnosed based on a medical history and a physical exam. To determine if you have mild or severe GERD, your health care provider may also monitor how you respond to treatment. You may also have tests, including: A test to examine your stomach and esophagus with a small camera (endoscopy). A test that measures the acidity level in your esophagus. A test that measures how much pressure is on your esophagus. A barium swallow or modified barium swallow test to show the shape, size, and functioning of your esophagus. How is this treated? Treatment for this condition may vary depending on how severe your symptoms are. Your health care provider may recommend: Changes to your diet. Medicine. Surgery. The goal of treatment is to help relieve your symptoms and to prevent complications. Follow these instructions at home: Eating and drinking  Follow a diet as recommended by your health care provider. This may involve avoiding foods and drinks such as: Coffee and tea, with or without caffeine. Drinks that contain alcohol. Energy drinks and sports drinks. Carbonated drinks or sodas. Chocolate and cocoa. Peppermint and mint flavorings. Garlic and onions. Horseradish. Spicy and acidic foods, including peppers, chili powder, curry powder, vinegar, hot sauces, and barbecue sauce. Citrus fruit juices and citrus fruits, such as oranges, lemons, and limes. Tomato-based foods, such as red sauce, chili, salsa, and pizza with red sauce. Fried and fatty foods, such  as donuts, french fries, potato chips, and high-fat dressings. High-fat meats, such as hot dogs and fatty cuts of red and white meats, such as  rib eye steak, sausage, ham, and bacon. High-fat dairy items, such as whole milk, butter, and cream cheese. Eat small, frequent meals instead of large meals. Avoid drinking large amounts of liquid with your meals. Avoid eating meals during the 2-3 hours before bedtime. Avoid lying down right after you eat. Do not exercise right after you eat. Lifestyle  Do not use any products that contain nicotine or tobacco. These products include cigarettes, chewing tobacco, and vaping devices, such as e-cigarettes. If you need help quitting, ask your health care provider. Try to reduce your stress by using methods such as yoga or meditation. If you need help reducing stress, ask your health care provider. If you are overweight, reduce your weight to an amount that is healthy for you. Ask your health care provider for guidance about a safe weight loss goal. General instructions Pay attention to any changes in your symptoms. Take over-the-counter and prescription medicines only as told by your health care provider. Do not take aspirin, ibuprofen, or other NSAIDs unless your health care provider told you to take these medicines. Wear loose-fitting clothing. Do not wear anything tight around your waist that causes pressure on your abdomen. Raise (elevate) the head of your bed about 6 inches (15 cm). You can use a wedge to do this. Avoid bending over if this makes your symptoms worse. Keep all follow-up visits. This is important. Contact a health care provider if: You have: New symptoms. Unexplained weight loss. Difficulty swallowing or it hurts to swallow. Wheezing or a persistent cough. A hoarse voice. Your symptoms do not improve with treatment. Get help right away if: You have sudden pain in your arms, neck, jaw, teeth, or back. You suddenly feel sweaty, dizzy, or light-headed. You have chest pain or shortness of breath. You vomit and the vomit is green, yellow, or black, or it looks like blood or  coffee grounds. You faint. You have stool that is red, bloody, or black. You cannot swallow, drink, or eat. These symptoms may represent a serious problem that is an emergency. Do not wait to see if the symptoms will go away. Get medical help right away. Call your local emergency services (911 in the [place] Do not drive yourself to the hospital. Summary Gastroesophageal reflux happens when acid from the stomach flows up into the esophagus. GERD is a disease in which the reflux happens often, causes frequent or severe symptoms, or causes problems such as damage to the esophagus. Treatment for this condition may vary depending on how severe your symptoms are. Your health care provider may recommend diet and lifestyle changes, medicine, or surgery. Contact a health care provider if you have new or worsening symptoms. Take over-the-counter and prescription medicines only as told by your health care provider. Do not take aspirin, ibuprofen, or other NSAIDs unless your health care provider told you to do so. Keep all follow-up visits as told by your health care provider. This is important. This information is not intended to replace advice given to you by your health care provider. Make sure you discuss any questions you have with your health care provider. Document Revised: 08/23/2019 Document Reviewed: 08/23/2019 Elsevier Patient Education  [place].  If you are age 37 or older, your body mass index should be between 23-30. Your Body mass index is 24.11 kg/m. If this is  out of the aforementioned range listed, please consider follow up with your Primary Care Provider.  If you are age 89 or younger, your body mass index should be between 19-25. Your Body mass index is 24.11 kg/m. If this is out of the aformentioned range listed, please consider follow up with your Primary Care Provider.   ________________________________________________________  The [facility] providers would like to  encourage you to use [facility] to communicate with providers for non-urgent requests or questions.  Due to long hold times on the telephone, sending your provider a message by [facility] may be a faster and more efficient way to get a response.  Please allow 48 business hours for a response.  Please remember that this is for non-urgent requests.  _______________________________________________________   I appreciate the  opportunity to care for you  Thank You   Harl Bowie , MD

## 2021-08-15 ENCOUNTER — Encounter: Payer: Self-pay | Admitting: Gastroenterology

## 2021-09-03 DIAGNOSIS — Z08 Encounter for follow-up examination after completed treatment for malignant neoplasm: Secondary | ICD-10-CM | POA: Diagnosis not present

## 2021-09-03 DIAGNOSIS — Z85828 Personal history of other malignant neoplasm of skin: Secondary | ICD-10-CM | POA: Diagnosis not present

## 2021-10-16 DIAGNOSIS — I10 Essential (primary) hypertension: Secondary | ICD-10-CM | POA: Diagnosis not present

## 2021-10-16 DIAGNOSIS — Z789 Other specified health status: Secondary | ICD-10-CM | POA: Diagnosis not present

## 2021-10-16 DIAGNOSIS — Z299 Encounter for prophylactic measures, unspecified: Secondary | ICD-10-CM | POA: Diagnosis not present

## 2021-10-16 DIAGNOSIS — R809 Proteinuria, unspecified: Secondary | ICD-10-CM | POA: Diagnosis not present

## 2021-10-16 DIAGNOSIS — E1129 Type 2 diabetes mellitus with other diabetic kidney complication: Secondary | ICD-10-CM | POA: Diagnosis not present

## 2021-10-26 DIAGNOSIS — Z79899 Other long term (current) drug therapy: Secondary | ICD-10-CM | POA: Diagnosis not present

## 2021-10-26 DIAGNOSIS — M859 Disorder of bone density and structure, unspecified: Secondary | ICD-10-CM | POA: Diagnosis not present

## 2021-10-26 DIAGNOSIS — E2839 Other primary ovarian failure: Secondary | ICD-10-CM | POA: Diagnosis not present

## 2021-12-12 DIAGNOSIS — H524 Presbyopia: Secondary | ICD-10-CM | POA: Diagnosis not present

## 2021-12-12 DIAGNOSIS — H43813 Vitreous degeneration, bilateral: Secondary | ICD-10-CM | POA: Diagnosis not present

## 2022-01-30 DIAGNOSIS — E1129 Type 2 diabetes mellitus with other diabetic kidney complication: Secondary | ICD-10-CM | POA: Diagnosis not present

## 2022-01-30 DIAGNOSIS — R809 Proteinuria, unspecified: Secondary | ICD-10-CM | POA: Diagnosis not present

## 2022-01-30 DIAGNOSIS — Z299 Encounter for prophylactic measures, unspecified: Secondary | ICD-10-CM | POA: Diagnosis not present

## 2022-01-30 DIAGNOSIS — I10 Essential (primary) hypertension: Secondary | ICD-10-CM | POA: Diagnosis not present

## 2022-01-30 DIAGNOSIS — Z789 Other specified health status: Secondary | ICD-10-CM | POA: Diagnosis not present

## 2022-01-30 DIAGNOSIS — M8580 Other specified disorders of bone density and structure, unspecified site: Secondary | ICD-10-CM | POA: Diagnosis not present

## 2022-02-15 DIAGNOSIS — Z1331 Encounter for screening for depression: Secondary | ICD-10-CM | POA: Diagnosis not present

## 2022-02-15 DIAGNOSIS — I10 Essential (primary) hypertension: Secondary | ICD-10-CM | POA: Diagnosis not present

## 2022-02-15 DIAGNOSIS — Z6824 Body mass index (BMI) 24.0-24.9, adult: Secondary | ICD-10-CM | POA: Diagnosis not present

## 2022-02-15 DIAGNOSIS — R5383 Other fatigue: Secondary | ICD-10-CM | POA: Diagnosis not present

## 2022-02-15 DIAGNOSIS — Z1339 Encounter for screening examination for other mental health and behavioral disorders: Secondary | ICD-10-CM | POA: Diagnosis not present

## 2022-02-15 DIAGNOSIS — Z79899 Other long term (current) drug therapy: Secondary | ICD-10-CM | POA: Diagnosis not present

## 2022-02-15 DIAGNOSIS — E7800 Pure hypercholesterolemia, unspecified: Secondary | ICD-10-CM | POA: Diagnosis not present

## 2022-02-15 DIAGNOSIS — Z789 Other specified health status: Secondary | ICD-10-CM | POA: Diagnosis not present

## 2022-02-15 DIAGNOSIS — Z0000 Encounter for general adult medical examination without abnormal findings: Secondary | ICD-10-CM | POA: Diagnosis not present

## 2022-02-15 DIAGNOSIS — Z7189 Other specified counseling: Secondary | ICD-10-CM | POA: Diagnosis not present

## 2022-02-15 DIAGNOSIS — Z299 Encounter for prophylactic measures, unspecified: Secondary | ICD-10-CM | POA: Diagnosis not present

## 2022-03-04 DIAGNOSIS — Z08 Encounter for follow-up examination after completed treatment for malignant neoplasm: Secondary | ICD-10-CM | POA: Diagnosis not present

## 2022-03-04 DIAGNOSIS — L570 Actinic keratosis: Secondary | ICD-10-CM | POA: Diagnosis not present

## 2022-03-04 DIAGNOSIS — Z85828 Personal history of other malignant neoplasm of skin: Secondary | ICD-10-CM | POA: Diagnosis not present

## 2022-03-04 DIAGNOSIS — X32XXXD Exposure to sunlight, subsequent encounter: Secondary | ICD-10-CM | POA: Diagnosis not present

## 2022-05-22 DIAGNOSIS — E1129 Type 2 diabetes mellitus with other diabetic kidney complication: Secondary | ICD-10-CM | POA: Diagnosis not present

## 2022-05-22 DIAGNOSIS — R809 Proteinuria, unspecified: Secondary | ICD-10-CM | POA: Diagnosis not present

## 2022-05-22 DIAGNOSIS — Z299 Encounter for prophylactic measures, unspecified: Secondary | ICD-10-CM | POA: Diagnosis not present

## 2022-05-22 DIAGNOSIS — I10 Essential (primary) hypertension: Secondary | ICD-10-CM | POA: Diagnosis not present

## 2022-09-03 ENCOUNTER — Ambulatory Visit
Admission: RE | Admit: 2022-09-03 | Discharge: 2022-09-03 | Disposition: A | Discharge status: Home / Self Care | Payer: Medicare HMO | Source: Ambulatory Visit | Attending: Nurse Practitioner | Admitting: Nurse Practitioner

## 2022-09-03 ENCOUNTER — Other Ambulatory Visit: Payer: Self-pay | Admitting: Nurse Practitioner

## 2022-09-03 DIAGNOSIS — E7800 Pure hypercholesterolemia, unspecified: Secondary | ICD-10-CM | POA: Diagnosis not present

## 2022-09-03 DIAGNOSIS — Z1231 Encounter for screening mammogram for malignant neoplasm of breast: Secondary | ICD-10-CM

## 2022-09-03 DIAGNOSIS — E559 Vitamin D deficiency, unspecified: Secondary | ICD-10-CM | POA: Diagnosis not present

## 2022-09-03 DIAGNOSIS — Z1339 Encounter for screening examination for other mental health and behavioral disorders: Secondary | ICD-10-CM | POA: Diagnosis not present

## 2022-09-03 DIAGNOSIS — R5383 Other fatigue: Secondary | ICD-10-CM | POA: Diagnosis not present

## 2022-09-03 DIAGNOSIS — Z0000 Encounter for general adult medical examination without abnormal findings: Secondary | ICD-10-CM | POA: Diagnosis not present

## 2022-09-03 DIAGNOSIS — Z79899 Other long term (current) drug therapy: Secondary | ICD-10-CM | POA: Diagnosis not present

## 2022-09-03 DIAGNOSIS — Z7189 Other specified counseling: Secondary | ICD-10-CM | POA: Diagnosis not present

## 2022-09-03 DIAGNOSIS — E1165 Type 2 diabetes mellitus with hyperglycemia: Secondary | ICD-10-CM | POA: Diagnosis not present

## 2022-09-03 DIAGNOSIS — Z1331 Encounter for screening for depression: Secondary | ICD-10-CM | POA: Diagnosis not present

## 2022-09-03 DIAGNOSIS — Z299 Encounter for prophylactic measures, unspecified: Secondary | ICD-10-CM | POA: Diagnosis not present

## 2022-09-03 DIAGNOSIS — I10 Essential (primary) hypertension: Secondary | ICD-10-CM | POA: Diagnosis not present

## 2022-10-16 DIAGNOSIS — N390 Urinary tract infection, site not specified: Secondary | ICD-10-CM | POA: Diagnosis not present

## 2022-10-16 DIAGNOSIS — I10 Essential (primary) hypertension: Secondary | ICD-10-CM | POA: Diagnosis not present

## 2022-10-16 DIAGNOSIS — Z299 Encounter for prophylactic measures, unspecified: Secondary | ICD-10-CM | POA: Diagnosis not present

## 2022-10-16 DIAGNOSIS — R350 Frequency of micturition: Secondary | ICD-10-CM | POA: Diagnosis not present

## 2022-10-21 DIAGNOSIS — X32XXXD Exposure to sunlight, subsequent encounter: Secondary | ICD-10-CM | POA: Diagnosis not present

## 2022-10-21 DIAGNOSIS — L570 Actinic keratosis: Secondary | ICD-10-CM | POA: Diagnosis not present

## 2022-10-21 DIAGNOSIS — D0461 Carcinoma in situ of skin of right upper limb, including shoulder: Secondary | ICD-10-CM | POA: Diagnosis not present

## 2022-10-21 DIAGNOSIS — Z85828 Personal history of other malignant neoplasm of skin: Secondary | ICD-10-CM | POA: Diagnosis not present

## 2022-10-21 DIAGNOSIS — Z08 Encounter for follow-up examination after completed treatment for malignant neoplasm: Secondary | ICD-10-CM | POA: Diagnosis not present

## 2022-10-31 DIAGNOSIS — N390 Urinary tract infection, site not specified: Secondary | ICD-10-CM | POA: Diagnosis not present

## 2022-10-31 DIAGNOSIS — Z299 Encounter for prophylactic measures, unspecified: Secondary | ICD-10-CM | POA: Diagnosis not present

## 2022-10-31 DIAGNOSIS — R350 Frequency of micturition: Secondary | ICD-10-CM | POA: Diagnosis not present

## 2022-10-31 DIAGNOSIS — I10 Essential (primary) hypertension: Secondary | ICD-10-CM | POA: Diagnosis not present

## 2022-11-21 DIAGNOSIS — N390 Urinary tract infection, site not specified: Secondary | ICD-10-CM | POA: Diagnosis not present

## 2022-11-21 DIAGNOSIS — R350 Frequency of micturition: Secondary | ICD-10-CM | POA: Diagnosis not present

## 2022-11-21 DIAGNOSIS — I10 Essential (primary) hypertension: Secondary | ICD-10-CM | POA: Diagnosis not present

## 2022-11-25 DIAGNOSIS — Z23 Encounter for immunization: Secondary | ICD-10-CM | POA: Diagnosis not present

## 2022-11-25 DIAGNOSIS — E1165 Type 2 diabetes mellitus with hyperglycemia: Secondary | ICD-10-CM | POA: Diagnosis not present

## 2022-11-25 DIAGNOSIS — Z6826 Body mass index (BMI) 26.0-26.9, adult: Secondary | ICD-10-CM | POA: Diagnosis not present

## 2022-11-25 DIAGNOSIS — I10 Essential (primary) hypertension: Secondary | ICD-10-CM | POA: Diagnosis not present

## 2022-12-02 DIAGNOSIS — Z85828 Personal history of other malignant neoplasm of skin: Secondary | ICD-10-CM | POA: Diagnosis not present

## 2022-12-02 DIAGNOSIS — Z08 Encounter for follow-up examination after completed treatment for malignant neoplasm: Secondary | ICD-10-CM | POA: Diagnosis not present

## 2022-12-12 DIAGNOSIS — H43813 Vitreous degeneration, bilateral: Secondary | ICD-10-CM | POA: Diagnosis not present

## 2022-12-12 DIAGNOSIS — H524 Presbyopia: Secondary | ICD-10-CM | POA: Diagnosis not present

## 2022-12-23 DIAGNOSIS — L570 Actinic keratosis: Secondary | ICD-10-CM | POA: Diagnosis not present

## 2022-12-23 DIAGNOSIS — Z85828 Personal history of other malignant neoplasm of skin: Secondary | ICD-10-CM | POA: Diagnosis not present

## 2022-12-23 DIAGNOSIS — Z08 Encounter for follow-up examination after completed treatment for malignant neoplasm: Secondary | ICD-10-CM | POA: Diagnosis not present

## 2022-12-23 DIAGNOSIS — X32XXXD Exposure to sunlight, subsequent encounter: Secondary | ICD-10-CM | POA: Diagnosis not present

## 2023-03-19 DIAGNOSIS — Z1329 Encounter for screening for other suspected endocrine disorder: Secondary | ICD-10-CM | POA: Diagnosis not present

## 2023-03-19 DIAGNOSIS — Z13220 Encounter for screening for lipoid disorders: Secondary | ICD-10-CM | POA: Diagnosis not present

## 2023-03-19 DIAGNOSIS — E1165 Type 2 diabetes mellitus with hyperglycemia: Secondary | ICD-10-CM | POA: Diagnosis not present

## 2023-03-19 DIAGNOSIS — Z1321 Encounter for screening for nutritional disorder: Secondary | ICD-10-CM | POA: Diagnosis not present

## 2023-03-19 DIAGNOSIS — I10 Essential (primary) hypertension: Secondary | ICD-10-CM | POA: Diagnosis not present

## 2023-03-25 DIAGNOSIS — K2100 Gastro-esophageal reflux disease with esophagitis, without bleeding: Secondary | ICD-10-CM | POA: Diagnosis not present

## 2023-03-25 DIAGNOSIS — Z6824 Body mass index (BMI) 24.0-24.9, adult: Secondary | ICD-10-CM | POA: Diagnosis not present

## 2023-03-25 DIAGNOSIS — Z0001 Encounter for general adult medical examination with abnormal findings: Secondary | ICD-10-CM | POA: Diagnosis not present

## 2023-03-25 DIAGNOSIS — I10 Essential (primary) hypertension: Secondary | ICD-10-CM | POA: Diagnosis not present

## 2023-03-25 DIAGNOSIS — Z23 Encounter for immunization: Secondary | ICD-10-CM | POA: Diagnosis not present

## 2023-03-25 DIAGNOSIS — Z2911 Encounter for prophylactic immunotherapy for respiratory syncytial virus (RSV): Secondary | ICD-10-CM | POA: Diagnosis not present

## 2023-03-25 DIAGNOSIS — E1165 Type 2 diabetes mellitus with hyperglycemia: Secondary | ICD-10-CM | POA: Diagnosis not present

## 2023-07-23 DIAGNOSIS — E1165 Type 2 diabetes mellitus with hyperglycemia: Secondary | ICD-10-CM | POA: Diagnosis not present

## 2023-07-23 DIAGNOSIS — I10 Essential (primary) hypertension: Secondary | ICD-10-CM | POA: Diagnosis not present

## 2023-07-23 DIAGNOSIS — Z1329 Encounter for screening for other suspected endocrine disorder: Secondary | ICD-10-CM | POA: Diagnosis not present

## 2023-07-23 DIAGNOSIS — Z13220 Encounter for screening for lipoid disorders: Secondary | ICD-10-CM | POA: Diagnosis not present

## 2023-07-30 DIAGNOSIS — Z6824 Body mass index (BMI) 24.0-24.9, adult: Secondary | ICD-10-CM | POA: Diagnosis not present

## 2023-07-30 DIAGNOSIS — E1165 Type 2 diabetes mellitus with hyperglycemia: Secondary | ICD-10-CM | POA: Diagnosis not present

## 2023-07-30 DIAGNOSIS — Z23 Encounter for immunization: Secondary | ICD-10-CM | POA: Diagnosis not present

## 2023-07-30 DIAGNOSIS — K2100 Gastro-esophageal reflux disease with esophagitis, without bleeding: Secondary | ICD-10-CM | POA: Diagnosis not present

## 2023-07-30 DIAGNOSIS — I10 Essential (primary) hypertension: Secondary | ICD-10-CM | POA: Diagnosis not present

## 2023-12-15 DIAGNOSIS — H43811 Vitreous degeneration, right eye: Secondary | ICD-10-CM | POA: Diagnosis not present

## 2023-12-15 DIAGNOSIS — H524 Presbyopia: Secondary | ICD-10-CM | POA: Diagnosis not present

## 2024-01-20 DIAGNOSIS — N390 Urinary tract infection, site not specified: Secondary | ICD-10-CM | POA: Diagnosis not present

## 2024-01-20 DIAGNOSIS — K0889 Other specified disorders of teeth and supporting structures: Secondary | ICD-10-CM | POA: Diagnosis not present

## 2024-01-20 DIAGNOSIS — R300 Dysuria: Secondary | ICD-10-CM | POA: Diagnosis not present

## 2024-01-20 DIAGNOSIS — Z6823 Body mass index (BMI) 23.0-23.9, adult: Secondary | ICD-10-CM | POA: Diagnosis not present
